# Patient Record
Sex: Female | Born: 1945 | Race: White | Hispanic: No | Smoking: Current every day smoker
Health system: Southern US, Community
[De-identification: ages and names within clinical notes are randomized; demographics above are authoritative.]

## PROBLEM LIST (undated history)

## (undated) DIAGNOSIS — J45909 Unspecified asthma, uncomplicated: Secondary | ICD-10-CM

## (undated) DIAGNOSIS — M8580 Other specified disorders of bone density and structure, unspecified site: Secondary | ICD-10-CM

## (undated) DIAGNOSIS — J449 Chronic obstructive pulmonary disease, unspecified: Secondary | ICD-10-CM

## (undated) HISTORY — DX: Other specified disorders of bone density and structure, unspecified site: M85.80

---

## 2014-08-22 LAB — HEPATIC FUNCTION PANEL
ALK PHOS: 51 U/L (ref 25–125)
ALT: 16 U/L (ref 7–35)
AST: 19 U/L (ref 13–35)

## 2014-08-22 LAB — HEPATITIS C ANTIBODY: HEP C VIRUS AB: NONREACTIVE

## 2014-08-22 LAB — VITAMIN D 1,25 DIHYDROXY: VIT D 25 HYDROXY: 25

## 2014-08-22 LAB — BASIC METABOLIC PANEL
CREATININE: 0.8 mg/dL (ref <=1.1)
Glucose: 94 mg/dL
Potassium: 4.4 mmol/L (ref 3.4–5.3)
Sodium: 141 mmol/L (ref 137–147)

## 2014-08-22 LAB — CALCIUM: Calcium: 9.3 mg/dL

## 2015-02-25 ENCOUNTER — Ambulatory Visit (INDEPENDENT_AMBULATORY_CARE_PROVIDER_SITE_OTHER): Payer: Federal, State, Local not specified - PPO | Admitting: Family Medicine

## 2015-02-25 ENCOUNTER — Encounter: Payer: Self-pay | Admitting: Family Medicine

## 2015-02-25 VITALS — BP 149/87 | HR 71 | Ht 62.0 in | Wt 130.0 lb

## 2015-02-25 DIAGNOSIS — M25552 Pain in left hip: Secondary | ICD-10-CM | POA: Diagnosis not present

## 2015-02-25 DIAGNOSIS — G459 Transient cerebral ischemic attack, unspecified: Secondary | ICD-10-CM | POA: Diagnosis not present

## 2015-02-25 DIAGNOSIS — Z23 Encounter for immunization: Secondary | ICD-10-CM | POA: Diagnosis not present

## 2015-02-25 MED ORDER — ASPIRIN EC 81 MG PO TBEC
81.0000 mg | DELAYED_RELEASE_TABLET | Freq: Every day | ORAL | Status: DC
Start: 2015-02-25 — End: 2016-07-14

## 2015-02-25 NOTE — Progress Notes (Signed)
CC: Lori Estes is a 70 y.o. female is here for Establish Care   Subjective: HPI:   very pleasant 70 year old here to establish care  Two and a half weeks ago she was raking leaves and felt a new type of headache that she's never felt before, she began to feel weak and decided to take a nap. When she woke up she had difficulty with word finding and lost proprioception in her left foot. She tells me she kept feeling like her left leg was stepping into a hole. Symptoms lasted 3-4 hours and resolved without any intervention other than lying down. Since then she's not had any continuing motor or sensory disturbances nor headache. She feels like she is back to her normal state. Over the weekend she started taking aspirin at 81 mg daily. She's never had any motor or sensory disturbances like the above in the past. She was not taking any medication at or around the time of the above issues. She is a smoker and does not know her most recent lipid panel but she brings in blood work reflecting a normal complete metabolic panel back in July.   she tells me she's always had normotensive blood pressure readings. Her systolic today surprises her.  She denies any history of a irregular heartbeat  Before leaving the [facility] area a few weeks ago she had Read on her left hip she never got the results for a plan to treat left chronic hip pain.  Review of Systems - General ROS: negative for - chills, fever, night sweats, weight gain or weight loss Ophthalmic ROS: negative for - decreased vision Psychological ROS: negative for - anxiety or depression ENT ROS: negative for - hearing change, nasal congestion, tinnitus or allergies Hematological and Lymphatic ROS: negative for - bleeding problems, bruising or swollen lymph nodes Breast ROS: negative Respiratory ROS: no cough, shortness of breath, or wheezing Cardiovascular ROS: no chest pain or dyspnea on exertion Gastrointestinal ROS: no abdominal pain, change  in bowel habits, or black or bloody stools Genito-Urinary ROS: negative for - genital discharge, genital ulcers, incontinence or abnormal bleeding from genitals Musculoskeletal ROS: negative for - joint pain or muscle painother than that described above Neurological ROS: negative for - memory loss Dermatological ROS: negative for lumps, mole changes, rash and skin lesion changes  No past medical history on file.  No past surgical history on file. No family history on file.  Social History   Social History  . Marital Status: Divorced    Spouse Name: N/A  . Number of Children: N/A  . Years of Education: N/A   Occupational History  . Not on file.   Social History Main Topics  . Smoking status: Current Every Day Smoker  . Smokeless tobacco: Not on file  . Alcohol Use: Not on file  . Drug Use: Not on file  . Sexual Activity: Not on file   Other Topics Concern  . Not on file   Social History Narrative  . No narrative on file     Objective: BP 149/87 mmHg  Pulse 71  Ht  (1.575 m)  Wt 130 lb (58.968 kg)  BMI 23.77 kg/m2  General: Alert and Oriented, No Acute Distress HEENT: Pupils equal, round, reactive to light. Conjunctivae clear.  Moist mucous membranes pharynx unremarkable Lungs: Clear to auscultation bilaterally, no wheezing/ronchi/rales.  Comfortable work of breathing. Good air movement. Cardiac: Regular rate and rhythm. Normal S1/S2.  No murmurs, rubs, nor gallops. No carotid bruit  Neuro: CN II-XII grossly intact, full strength/rom of all four extremities,  gait normal, rapid alternating movements normal, Rhomberg normal. Extremities: No peripheral edema.  Strong peripheral pulses.  Mental Status: No depression, anxiety, nor agitation. Skin: Warm and dry.  Assessment & Plan: Lori Estes was seen today for establish care.  Diagnoses and all orders for this visit:  Transient cerebral ischemia, unspecified transient cerebral ischemia type -     CBC -     aspirin  EC 81 MG tablet; Take 1 tablet (81 mg total) by mouth daily. -     US Carotid Duplex Bilateral; Future -     Echocardiogram; Future -     Direct LDL  Left hip pain  Other orders -     Flu Vaccine QUAD 36+ mos IM   Symptoms are concerning for TIA, agree with her decision to start aspirin at 81 mg. Checking a CBC today and her LDL, encouraged to quit smoking. Will readdress blood pressure at her next visit hopefully within the next few weeks. Check an echocardiogram and carotid Doppler.  She is agreeable to looking into her left hip pain at a future visit when records are available to review with her. Records have been requested.  Return if symptoms worsen or fail to improve.

## 2015-02-26 ENCOUNTER — Other Ambulatory Visit: Payer: Self-pay

## 2015-02-26 ENCOUNTER — Telehealth: Payer: Self-pay | Admitting: Family Medicine

## 2015-02-26 DIAGNOSIS — G459 Transient cerebral ischemic attack, unspecified: Secondary | ICD-10-CM

## 2015-02-26 LAB — CBC
HCT: 45.7 % (ref 36.0–46.0)
Hemoglobin: 15.8 g/dL — ABNORMAL HIGH (ref 12.0–15.0)
MCH: 34.4 pg — AB (ref 26.0–34.0)
MCHC: 34.6 g/dL (ref 30.0–36.0)
MCV: 99.6 fL (ref 78.0–100.0)
MPV: 10 fL (ref 8.6–12.4)
PLATELETS: 238 10*3/uL (ref 150–400)
RBC: 4.59 MIL/uL (ref 3.87–5.11)
RDW: 12.7 % (ref 11.5–15.5)
WBC: 6.3 10*3/uL (ref 4.0–10.5)

## 2015-02-26 LAB — LDL CHOLESTEROL, DIRECT: LDL DIRECT: 162 mg/dL — AB (ref <130)

## 2015-02-26 MED ORDER — ATORVASTATIN CALCIUM 10 MG PO TABS: 10.0000 mg | ORAL_TABLET | Freq: Every day | ORAL | Status: DC

## 2015-02-26 NOTE — Telephone Encounter (Signed)
Left message to call office for recommendations.

## 2015-02-26 NOTE — Telephone Encounter (Signed)
Will you please let patient know that her platelet count was normal but her LDL cholesterol was significantly elevated which put's her at risk of having more TIAs or even a stroke. i'd recommend starting a daily cholesterol lowering medication called atorvastatin that I'll send to [facility].

## 2015-03-03 ENCOUNTER — Ambulatory Visit (HOSPITAL_COMMUNITY)
Admission: RE | Admit: 2015-03-03 | Discharge: 2015-03-03 | Disposition: A | Discharge status: Home / Self Care | Payer: Federal, State, Local not specified - PPO | Source: Ambulatory Visit | Attending: Cardiology | Admitting: Cardiology

## 2015-03-03 DIAGNOSIS — G459 Transient cerebral ischemic attack, unspecified: Secondary | ICD-10-CM | POA: Diagnosis present

## 2015-03-03 DIAGNOSIS — I6523 Occlusion and stenosis of bilateral carotid arteries: Secondary | ICD-10-CM | POA: Insufficient documentation

## 2015-03-05 ENCOUNTER — Ambulatory Visit (HOSPITAL_BASED_OUTPATIENT_CLINIC_OR_DEPARTMENT_OTHER)
Admission: RE | Admit: 2015-03-05 | Discharge: 2015-03-05 | Disposition: A | Discharge status: Home / Self Care | Payer: Federal, State, Local not specified - PPO | Source: Ambulatory Visit | Attending: Family Medicine | Admitting: Family Medicine

## 2015-03-05 DIAGNOSIS — F17200 Nicotine dependence, unspecified, uncomplicated: Secondary | ICD-10-CM | POA: Insufficient documentation

## 2015-03-05 DIAGNOSIS — I517 Cardiomegaly: Secondary | ICD-10-CM | POA: Diagnosis not present

## 2015-03-05 DIAGNOSIS — I340 Nonrheumatic mitral (valve) insufficiency: Secondary | ICD-10-CM | POA: Insufficient documentation

## 2015-03-05 DIAGNOSIS — G459 Transient cerebral ischemic attack, unspecified: Secondary | ICD-10-CM | POA: Insufficient documentation

## 2015-03-05 NOTE — Progress Notes (Signed)
Echocardiogram 2D Echocardiogram has been performed.  Dorothey Baseman 03/05/2015, 11:39 AM

## 2015-03-06 ENCOUNTER — Encounter: Payer: Self-pay | Admitting: Family Medicine

## 2015-03-06 DIAGNOSIS — M8580 Other specified disorders of bone density and structure, unspecified site: Secondary | ICD-10-CM | POA: Insufficient documentation

## 2015-03-06 DIAGNOSIS — E559 Vitamin D deficiency, unspecified: Secondary | ICD-10-CM | POA: Insufficient documentation

## 2015-03-06 HISTORY — DX: Other specified disorders of bone density and structure, unspecified site: M85.80

## 2015-03-14 ENCOUNTER — Encounter: Payer: Self-pay | Admitting: Family Medicine

## 2015-03-14 ENCOUNTER — Ambulatory Visit (INDEPENDENT_AMBULATORY_CARE_PROVIDER_SITE_OTHER): Payer: Federal, State, Local not specified - PPO | Admitting: Family Medicine

## 2015-03-14 VITALS — BP 154/79 | HR 71 | Wt 129.0 lb

## 2015-03-14 DIAGNOSIS — G459 Transient cerebral ischemic attack, unspecified: Secondary | ICD-10-CM | POA: Diagnosis not present

## 2015-03-14 DIAGNOSIS — I10 Essential (primary) hypertension: Secondary | ICD-10-CM | POA: Diagnosis not present

## 2015-03-14 MED ORDER — AMLODIPINE BESYLATE 5 MG PO TABS
5.0000 mg | ORAL_TABLET | Freq: Every day | ORAL | Status: DC
Start: 2015-03-14 — End: 2015-06-11

## 2015-03-14 NOTE — Progress Notes (Addendum)
CC: Lori Estes is a 70 y.o. female is here for Results and medication questions   Subjective: HPI:  Follow-up TIA: Over the past month she denies any headache or new motor or sensory disturbances. She denies any irregular heartbeat or bleeding/bruising abnormality since starting on aspirin a few weeks ago.   Follow-up essential hypertension: Continues to smoke a pack a day. No current antihypertensive regimen. She tells me she's always had normal blood pressure prior to her TIA episode earlier this year. She denies chest pain shortness of breath orthopnea nor peripheral edema. Taking atorvastatin without side effects.   Review Of Systems Outlined In HPI  Past Medical History  Diagnosis Date  . Osteopenia 03/06/2015    Outside records reflect raloxifene      History reviewed. No pertinent past surgical history. History reviewed. No pertinent family history.  Social History   Social History  . Marital Status: Divorced    Spouse Name: N/A  . Number of Children: N/A  . Years of Education: N/A   Occupational History  . Not on file.   Social History Main Topics  . Smoking status: Current Every Day Smoker  . Smokeless tobacco: Not on file  . Alcohol Use: Not on file  . Drug Use: Not on file  . Sexual Activity: Not on file   Other Topics Concern  . Not on file   Social History Narrative     Objective: BP 154/79 mmHg  Pulse 71  Wt 129 lb (58.514 kg)  Vital signs reviewed. General: Alert and Oriented, No Acute Distress HEENT: Pupils equal, round, reactive to light. Conjunctivae clear.  External ears unremarkable.  Moist mucous membranes. Lungs: Clear and comfortable work of breathing, speaking in full sentences without accessory muscle use. Cardiac: Regular rate and rhythm.  Neuro: CN II-XII grossly intact, gait normal. Extremities: No peripheral edema.  Strong peripheral pulses.  Mental Status: No depression, anxiety, nor agitation. Logical though process. Skin:  Warm and dry.  Assessment & Plan: Lori Estes was seen today for results and medication questions.  Diagnoses and all orders for this visit:  Transient cerebral ischemia, unspecified transient cerebral ischemia type  Essential hypertension -     amLODipine (NORVASC) 5 MG tablet; Take 1 tablet (5 mg total) by mouth daily.   TIA: No recurrence, encouraged her to start on amlodipine for uncontrolled hypertension along with continuing atorvastatin to help minimize risk of vascular event. I also discussed the benefits of quitting smoking with respect to cardiovascular health and pulmonary health. Encouraged her to pick a quit date, sure already has Chantix at home.  Return in about 3 months (around 06/11/2015).

## 2015-04-07 ENCOUNTER — Encounter: Payer: Self-pay | Admitting: Family Medicine

## 2015-06-11 ENCOUNTER — Encounter: Payer: Self-pay | Admitting: Family Medicine

## 2015-06-11 ENCOUNTER — Ambulatory Visit (INDEPENDENT_AMBULATORY_CARE_PROVIDER_SITE_OTHER): Payer: Federal, State, Local not specified - PPO | Admitting: Family Medicine

## 2015-06-11 VITALS — BP 115/71 | HR 77 | Wt 129.0 lb

## 2015-06-11 DIAGNOSIS — I10 Essential (primary) hypertension: Secondary | ICD-10-CM | POA: Diagnosis not present

## 2015-06-11 DIAGNOSIS — G459 Transient cerebral ischemic attack, unspecified: Secondary | ICD-10-CM

## 2015-06-11 DIAGNOSIS — Z9119 Patient's noncompliance with other medical treatment and regimen: Secondary | ICD-10-CM | POA: Insufficient documentation

## 2015-06-11 DIAGNOSIS — Z23 Encounter for immunization: Secondary | ICD-10-CM | POA: Diagnosis not present

## 2015-06-11 DIAGNOSIS — Z91199 Patient's noncompliance with other medical treatment and regimen due to unspecified reason: Secondary | ICD-10-CM | POA: Insufficient documentation

## 2015-06-11 NOTE — Progress Notes (Signed)
CC: Lori Estes is a 70 y.o. female is here for Hypertension   Subjective: HPI:  Follow-up essential hypertension: She decided to never start amlodipine and was motivated to decrease sodium in her diet and start being more active. She's been checking her blood pressure at home is consistently in the normotensive range. Denies chest pain shortness of breath orthopnea nor peripheral edema.  She also decided that she did not want to take a statin.   Review Of Systems Outlined In HPI  Past Medical History  Diagnosis Date  . Osteopenia 03/06/2015    Outside records reflect raloxifene 60mg      No past surgical history on file. No family history on file.  Social History   Social History  . Marital Status: Divorced    Spouse Name: N/A  . Number of Children: N/A  . Years of Education: N/A   Occupational History  . Not on file.   Social History Main Topics  . Smoking status: Current Every Day Smoker  . Smokeless tobacco: Not on file  . Alcohol Use: Not on file  . Drug Use: Not on file  . Sexual Activity: Not on file   Other Topics Concern  . Not on file   Social History Narrative     Objective: BP 115/71 mmHg  Pulse 77  Wt 129 lb (58.514 kg)  General: Alert and Oriented, No Acute Distress HEENT: Pupils equal, round, reactive to light. Conjunctivae clear.Moist mucous membranes Lungs: Clear to auscultation bilaterally, no wheezing/ronchi/rales.  Comfortable work of breathing. Good air movement. Cardiac: Regular rate and rhythm. Normal S1/S2.  No murmurs, rubs, nor gallops.   Neuro: Cranial nerves II through XII grossly intact Extremities: No peripheral edema.  Strong peripheral pulses.  Mental Status: No depression, anxiety, nor agitation. Skin: Warm and dry.  Assessment & Plan: Lori Estes was seen today for hypertension.  Diagnoses and all orders for this visit:  Need for prophylactic vaccination against Streptococcus pneumoniae (pneumococcus) -     Pneumococcal  conjugate vaccine 13-valent  Transient cerebral ischemia, unspecified transient cerebral ischemia type  Essential hypertension   Essential hypertension: Controlled with diet and exercise no need for limiting at this time History of TIA: Have encouraged her to take atorvastatin to lower her risk of having another TIA or possibly stroke, she politely declines.  Return in about 6 months (around 12/12/2015) for Blood Pressure Review.

## 2015-06-28 ENCOUNTER — Other Ambulatory Visit: Payer: Self-pay | Admitting: Family Medicine

## 2015-06-28 ENCOUNTER — Emergency Department (INDEPENDENT_AMBULATORY_CARE_PROVIDER_SITE_OTHER)
Admission: EM | Admit: 2015-06-28 | Discharge: 2015-06-28 | Disposition: A | Discharge status: Home / Self Care | Payer: Federal, State, Local not specified - PPO | Source: Home / Self Care | Attending: Family Medicine | Admitting: Family Medicine

## 2015-06-28 ENCOUNTER — Encounter: Payer: Self-pay | Admitting: Emergency Medicine

## 2015-06-28 DIAGNOSIS — S30861A Insect bite (nonvenomous) of abdominal wall, initial encounter: Secondary | ICD-10-CM | POA: Diagnosis not present

## 2015-06-28 DIAGNOSIS — W57XXXA Bitten or stung by nonvenomous insect and other nonvenomous arthropods, initial encounter: Secondary | ICD-10-CM | POA: Diagnosis not present

## 2015-06-28 MED ORDER — DOXYCYCLINE HYCLATE 100 MG PO CAPS: 100.0000 mg | ORAL_CAPSULE | Freq: Two times a day (BID) | ORAL | Status: DC

## 2015-06-28 NOTE — ED Notes (Signed)
Pt c/o tick bite x 10 days ago, developed a circular rash on her lower left side of abdomen.  Area is red, spreading and itching.

## 2015-06-28 NOTE — ED Provider Notes (Signed)
CSN: [id number]     Arrival date & time 06/28/15  1113 History   First MD Initiated Contact with Patient 06/28/15 1254     Chief Complaint  Patient presents with  . Rash      HPI Comments: Patient found an embedded tick on her left abdomen about 10 days ago, and believes that it had been present for less than four hours, and not engorged.  She then developed a rash at the tick site which has been increasing in size.  She feels well otherwise.  The history is provided by the patient.    Past Medical History  Diagnosis Date  . Osteopenia 03/06/2015    Outside records reflect raloxifene 60mg     History reviewed. No pertinent past surgical history. No family history on file. Social History  Substance Use Topics  . Smoking status: Current Every Day Smoker  . Smokeless tobacco: None  . Alcohol Use: None   OB History    No data available     Review of Systems  Constitutional: Negative for fever, chills, diaphoresis, activity change, appetite change and fatigue.  HENT: Negative.   Eyes: Negative.   Respiratory: Negative.   Cardiovascular: Negative.   Gastrointestinal: Negative.   Genitourinary: Negative.   Musculoskeletal: Negative.   Skin: Positive for rash.  Neurological: Negative for headaches.    Allergies  Ceftin and Doxycycline  Home Medications   Prior to Admission medications   Medication Sig Start Date End Date Taking? Authorizing Provider  aspirin EC 81 MG tablet Take 1 tablet (81 mg total) by mouth daily. 02/25/15   Laren BoomSean Hommel, DO  doxycycline (VIBRAMYCIN) 100 MG capsule Take 1 capsule (100 mg total) by mouth 2 (two) times daily. Take with food. 06/28/15   Lattie HawStephen A Emani Morad, MD   Meds Ordered and Administered this Visit  Medications - No data to display  BP 156/79 mmHg  Pulse 69  Temp(Src) 98.3 F (36.8 C) (Oral)  Ht 5\' 2"  (1.575 m)  Wt 130 lb 4 oz (59.081 kg)  BMI 23.82 kg/m2  SpO2 97% No data found.   Physical Exam  Constitutional: She is oriented to  person, place, and time. She appears well-developed and well-nourished. No distress.  HENT:  Head: Normocephalic.  Mouth/Throat: Oropharynx is clear and moist.  Eyes: Conjunctivae are normal. Pupils are equal, round, and reactive to light.  Neck: Neck supple.  Cardiovascular: Normal heart sounds.   Pulmonary/Chest: Breath sounds normal.  Abdominal: There is no tenderness.    Left mid-abdomen has an erythematous "bull's eye" lesion approximately 8cm diameter.  No fluctuance, swelling, or tenderness to palpation.  Musculoskeletal: She exhibits no edema.  Lymphadenopathy:    She has no cervical adenopathy.  Neurological: She is alert and oriented to person, place, and time.  Skin: Skin is warm and dry.  Nursing note and vitals reviewed.   ED Course  Procedures none    Labs Reviewed  ROCKY MTN SPOTTED FVR ABS PNL(IGG+IGM)  B. BURGDORFI ANTIBODIES     MDM   1. Tick bite of abdomen, initial encounter    RMSF and Lyme Disease antibodies pending.  Begin empiric doxycycline (patient notes that she can take doxycycline if she eats bread beforehand) Followup with Family Doctor if not improved in one week.     Lattie HawStephen A Jarry Manon, MD 07/03/15 [id number]

## 2015-06-28 NOTE — Discharge Instructions (Signed)
Tick Bite Information °Ticks are insects that attach themselves to the skin. There are many types of ticks. Common types include wood ticks and deer ticks. Sometimes, ticks carry diseases that can make a person very ill. The most common places for ticks to attach themselves are the scalp, neck, armpits, waist, and groin.  °HOW CAN YOU PREVENT TICK BITES? °Take these steps to help prevent tick bites when you are outdoors: °· Wear long sleeves and long pants. °· Wear white clothes so you can see ticks more easily. °· Tuck your pant legs into your socks. °· If walking on a trail, stay in the middle of the trail to avoid brushing against bushes. °· Avoid walking through areas with long grass. °· Put bug spray on all skin that is showing and along boot tops, pant legs, and sleeve cuffs. °· Check clothes, hair, and skin often and before going inside. °· Brush off any ticks that are not attached. °· Take a shower or bath as soon as possible after being outdoors. °HOW SHOULD YOU REMOVE A TICK? °Ticks should be removed as soon as possible to help prevent diseases. °1. If latex gloves are available, put them on before trying to remove a tick. °2. Use tweezers to grasp the tick as close to the skin as possible. You may also use curved forceps or a tick removal tool. Grasp the tick as close to its head as possible. Avoid grasping the tick on its body. °3. Pull gently upward until the tick lets go. Do not twist the tick or jerk it suddenly. This may break off the tick's head or mouth parts. °4. Do not squeeze or crush the tick's body. This could force disease-carrying fluids from the tick into your body. °5. After the tick is removed, wash the bite area and your hands with soap and water or alcohol. °6. Apply a small amount of antiseptic cream or ointment to the bite site. °7. Wash any tools that were used. °Do not try to remove a tick by applying a hot match, petroleum jelly, or fingernail polish to the tick. These methods do  not work. They may also increase the chances of disease being spread from the tick bite. °WHEN SHOULD YOU SEEK HELP? °Contact your health care provider if you are unable to remove a tick or if a part of the tick breaks off in the skin. °After a tick bite, you need to watch for signs and symptoms of diseases that can be spread by ticks. Contact your health care provider if you develop any of the following: °· Fever. °· Rash. °· Redness and puffiness (swelling) in the area of the tick bite. °· Tender, puffy lymph glands. °· Watery poop (diarrhea). °· Weight loss. °· Cough. °· Feeling more tired than normal (fatigue). °· Muscle, joint, or bone pain. °· Belly (abdominal) pain. °· Headache. °· Change in your level of consciousness. °· Trouble walking or moving your legs. °· Loss of feeling (numbness) in the legs. °· Loss of movement (paralysis). °· Shortness of breath. °· Confusion. °· Throwing up (vomiting) many times. °  °This information is not intended to replace advice given to you by your health care provider. Make sure you discuss any questions you have with your health care provider. °  °Document Released: 04/14/2009 Document Revised: 09/20/2012 Document Reviewed: 06/28/2012 °Elsevier Interactive Patient Education ©2016 [employer] ° °

## 2015-07-01 LAB — LYME AB/WESTERN BLOT REFLEX

## 2015-07-02 ENCOUNTER — Telehealth: Payer: Self-pay | Admitting: *Deleted

## 2015-07-02 LAB — ROCKY MTN SPOTTED FVR ABS PNL(IGG+IGM)
RMSF IGM: NOT DETECTED
RMSF IgG: NOT DETECTED

## 2015-07-02 NOTE — ED Notes (Unsigned)
LM with lab results and to call back if she has any questions or concerns. Clemens Catholichristy Dakiyah Heinke, LPN

## 2015-09-04 ENCOUNTER — Emergency Department (INDEPENDENT_AMBULATORY_CARE_PROVIDER_SITE_OTHER)
Admission: EM | Admit: 2015-09-04 | Discharge: 2015-09-04 | Disposition: A | Discharge status: Home / Self Care | Payer: Federal, State, Local not specified - PPO | Source: Home / Self Care | Attending: Family Medicine | Admitting: Family Medicine

## 2015-09-04 ENCOUNTER — Encounter: Payer: Self-pay | Admitting: Emergency Medicine

## 2015-09-04 DIAGNOSIS — R112 Nausea with vomiting, unspecified: Secondary | ICD-10-CM | POA: Diagnosis not present

## 2015-09-04 DIAGNOSIS — M545 Low back pain, unspecified: Secondary | ICD-10-CM

## 2015-09-04 DIAGNOSIS — N390 Urinary tract infection, site not specified: Secondary | ICD-10-CM | POA: Diagnosis not present

## 2015-09-04 DIAGNOSIS — R197 Diarrhea, unspecified: Secondary | ICD-10-CM

## 2015-09-04 LAB — POCT URINALYSIS DIP (MANUAL ENTRY)
Bilirubin, UA: NEGATIVE
Blood, UA: NEGATIVE
Glucose, UA: NEGATIVE
Nitrite, UA: NEGATIVE
Protein Ur, POC: NEGATIVE
Spec Grav, UA: 1.025 (ref 1.005–1.03)
Urobilinogen, UA: 0.2 (ref 0–1)
pH, UA: 6.5 (ref 5–8)

## 2015-09-04 MED ORDER — ONDANSETRON HCL 4 MG PO TABS
4.0000 mg | ORAL_TABLET | Freq: Four times a day (QID) | ORAL | 0 refills | Status: DC
Start: 2015-09-04 — End: 2016-07-14

## 2015-09-04 MED ORDER — AMOXICILLIN-POT CLAVULANATE 875-125 MG PO TABS: 1.0000 | ORAL_TABLET | Freq: Two times a day (BID) | ORAL | 0 refills | Status: DC

## 2015-09-04 NOTE — Discharge Instructions (Signed)
°  You may try over the counter imodium or peptobismol if you are having more than 3-4 episodes of diarrhea a day.  It is recommended you follow up with your Primary care provider or come back tomorrow for recheck of symptoms.

## 2015-09-04 NOTE — ED Provider Notes (Signed)
CSN: [id number]     Arrival date & time 09/04/15  1934 History   First MD Initiated Contact with Patient 09/04/15 1955     Chief Complaint  Patient presents with  . Back Pain   (Consider location/radiation/quality/duration/timing/severity/associated sxs/prior Treatment) HPI  Lori Estes is a 70 y.o. female presenting to UC with c/o intermittent diarrhea with watery stool 4-5 times daily for 1 week, no blood or mucous in stool, associated Right side low back pain for the last 2-3 days as she notes she is in the middle of a move and thinks she pulled a muscle.  Pain is burning in sensation moderate in severity, worse with movement and palpation. Last night she developed nausea and vomiting, vomiting twice with several dry heaves since onset of nausea. Last episode of vomiting was around 2PM. She has not tried any OTC medications today but had been taking ibuprofen for her back and wonders if that caused her stomach upset. No sick contacts or recent travel. No urinary symptoms.    Past Medical History:  Diagnosis Date  . Osteopenia 03/06/2015   Outside records reflect raloxifene     History reviewed. No pertinent surgical history. No family history on file. Social History  Substance Use Topics  . Smoking status: Current Every Day Smoker  . Smokeless tobacco: Never Used  . Alcohol use Not on file   OB History    No data available     Review of Systems  Constitutional: Positive for appetite change. Negative for chills, fatigue and fever.  HENT: Negative for congestion, rhinorrhea and sore throat.   Respiratory: Negative for cough and shortness of breath.   Gastrointestinal: Positive for diarrhea, nausea and vomiting. Negative for abdominal pain and blood in stool.  Genitourinary: Negative for dysuria, frequency and genital sores.  Musculoskeletal: Positive for back pain (Right lowr) and myalgias. Negative for gait problem, neck pain and neck stiffness.  Skin: Negative for rash.     Allergies  Ceftin [cefuroxime axetil] and Doxycycline  Home Medications   Prior to Admission medications   Medication Sig Start Date End Date Taking? Authorizing Provider  naproxen sodium (ANAPROX) 220 MG tablet Take 220 mg by mouth 2 (two) times daily with a meal.   Yes Historical Provider, MD  amoxicillin-clavulanate (AUGMENTIN) 875-125 MG tablet Take 1 tablet by mouth 2 (two) times daily. One po bid x 7 days 09/04/15   Junius Finner, PA-C  aspirin EC 81 MG tablet Take 1 tablet (81 mg total) by mouth daily. 02/25/15   Laren Boom, DO  doxycycline (VIBRAMYCIN) 100 MG capsule Take 1 capsule (100 mg total) by mouth 2 (two) times daily. Take with food. 06/28/15   Lattie Haw, MD  ondansetron (ZOFRAN) 4 MG tablet Take 1 tablet (4 mg total) by mouth every 6 (six) hours. 09/04/15   Junius Finner, PA-C   Meds Ordered and Administered this Visit  Medications - No data to display  BP 178/93 (BP Location: Left Arm)   Pulse 75   Temp 98.2 F (36.8 C) (Oral)   Ht  (1.575 m)   Wt 128 lb (58.1 kg)   SpO2 97%   BMI 23.41 kg/m  No data found.   Physical Exam  Constitutional: She is oriented to person, place, and time. She appears well-developed and well-nourished.  HENT:  Head: Normocephalic and atraumatic.  Mouth/Throat: Oropharynx is clear and moist.  Eyes: EOM are normal.  Neck: Normal range of motion. Neck supple.  Cardiovascular: Normal rate  and regular rhythm.   Pulmonary/Chest: Effort normal and breath sounds normal. No respiratory distress. She has no wheezes. She has no rales.  Abdominal: Soft. She exhibits no distension and no mass. There is no tenderness. There is no rebound and no guarding. No hernia.  Musculoskeletal: Normal range of motion. She exhibits tenderness. She exhibits no edema or deformity.  No midline spinal tenderness. Full ROM upper and lower extremities. Tenderness to Right lower lumbar muscle.   Neurological: She is alert and oriented to person, place,  and time.  Skin: Skin is warm and dry. No rash noted. No erythema.  Right lower back: skin in tact, no ecchymosis, erythema, rash or lesions.   Psychiatric: She has a normal mood and affect. Her behavior is normal.  Nursing note and vitals reviewed.   Urgent Care Course   Clinical Course    Procedures (including critical care time)  Labs Review Labs Reviewed  POCT URINALYSIS DIP (MANUAL ENTRY) - Abnormal; Notable for the following:       Result Value   Ketones, POC UA moderate (40) (*)    Leukocytes, UA small (1+) (*)    All other components within normal limits  URINE CULTURE    Imaging Review No results found.    MDM   1. Bilateral low back pain without sciatica   2. UTI (lower urinary tract infection)   3. Nausea vomiting and diarrhea    Pt c/o nausea, vomiting, and diarrhea. Also c/o Right lower back pain after moving boxes but also c/o burning pain with light touch. No rashes noted.  UA c/w UTI. Will treat UTI and nausea, will consider early onset shingles, however, will hold off on antivirals as this time as to not complicate symptoms.   Encouraged f/u tomorrow with PCP or return to Urgent care for recheck of symptoms. Pt able to keep down several ounces of ginger ale in UC.  Patient verbalized understanding and agreement with treatment plan.     Junius Finner, PA-C 09/04/15 2025

## 2015-09-04 NOTE — ED Triage Notes (Signed)
Low back pain x 2 days, diarrhea x 1 week, N&V started last night

## 2015-09-07 LAB — URINE CULTURE: Organism ID, Bacteria: NO GROWTH

## 2015-09-08 ENCOUNTER — Ambulatory Visit (INDEPENDENT_AMBULATORY_CARE_PROVIDER_SITE_OTHER): Payer: Federal, State, Local not specified - PPO | Admitting: Family Medicine

## 2015-09-08 ENCOUNTER — Encounter: Payer: Self-pay | Admitting: Family Medicine

## 2015-09-08 DIAGNOSIS — M545 Low back pain, unspecified: Secondary | ICD-10-CM | POA: Insufficient documentation

## 2015-09-08 NOTE — Progress Notes (Signed)
CC: Lori Estes is a 70 y.o. female is here for Back Pain   Subjective: HPI:  Early last week she woke up with a soreness in her right lower back. Was nonradiating. It was bad enough to keep her in bed for the majority of Thursday. Her sister brought her here to be evaluated or care. She tells me that the pain was worse with any movement of the torso or with light touch to the skin in the right flank and right low back. She was started on Augmentin for concerns of possibly having a UTI. She tells me that she typically easy up until Sunday when she was able to be more mobile and feels like the pain is almost 100% gone. Nothing right now particularly makes symptoms worse however it improves with Aleve. She denies any midline back pain. She denies any rash over the site of her discomfort. She continues to state that she does not have any urinary urgency hesitancy frequency or dysuria   Review Of Systems Outlined In HPI  Past Medical History:  Diagnosis Date  . Osteopenia 03/06/2015   Outside records reflect raloxifene 60mg      No past surgical history on file. No family history on file.  Social History   Social History  . Marital status: Divorced    Spouse name: N/A  . Number of children: N/A  . Years of education: N/A   Occupational History  . Not on file.   Social History Main Topics  . Smoking status: Current Every Day Smoker  . Smokeless tobacco: Never Used  . Alcohol use Not on file  . Drug use: Unknown  . Sexual activity: Not on file   Other Topics Concern  . Not on file   Social History Narrative  . No narrative on file     Objective: BP (!) 145/79   Pulse 76   Wt 125 lb (56.7 kg)   BMI 22.86 kg/m   Vital signs reviewed. General: Alert and Oriented, No Acute Distress HEENT: Pupils equal, round, reactive to light. Conjunctivae clear.  External ears unremarkable.  Moist mucous membranes. Lungs: Clear and comfortable work of breathing, speaking in full sentences  without accessory muscle use. Cardiac: Regular rate and rhythm.  Neuro: CN II-XII grossly intact, gait normal. Extremities: No peripheral edema.  Strong peripheral pulses.  Mental Status: No depression, anxiety, nor agitation. Logical though process. Skin: Warm and dry. No rash or abnormal palpable masses in the right low lumbar region where her pain was localized.  Assessment & Plan: Lori Estes was seen today for back pain.  Diagnoses and all orders for this visit:  Right-sided low back pain without sciatica   Low back pain most likely due to muscular strain given heavy lifting the day preceding onset of her pain. No further intervention is needed it appears To be almost 100% healed.  No Follow-up on file.   Discussed with this patient that I will be resigning from my position [facility] in September in order to stay with my [place] [facility] [place]. I let him know about the providers that are still accepting patients and I feel that this individual will be under great care if he/she stays here [facility].

## 2016-07-14 ENCOUNTER — Ambulatory Visit (INDEPENDENT_AMBULATORY_CARE_PROVIDER_SITE_OTHER): Payer: Federal, State, Local not specified - PPO

## 2016-07-14 ENCOUNTER — Encounter: Payer: Self-pay | Admitting: Physician Assistant

## 2016-07-14 ENCOUNTER — Ambulatory Visit (INDEPENDENT_AMBULATORY_CARE_PROVIDER_SITE_OTHER): Payer: Federal, State, Local not specified - PPO | Admitting: Physician Assistant

## 2016-07-14 VITALS — BP 179/79 | HR 84 | Ht 62.0 in | Wt 131.0 lb

## 2016-07-14 DIAGNOSIS — F17200 Nicotine dependence, unspecified, uncomplicated: Secondary | ICD-10-CM | POA: Diagnosis not present

## 2016-07-14 DIAGNOSIS — R0602 Shortness of breath: Secondary | ICD-10-CM | POA: Diagnosis not present

## 2016-07-14 DIAGNOSIS — R030 Elevated blood-pressure reading, without diagnosis of hypertension: Secondary | ICD-10-CM

## 2016-07-14 DIAGNOSIS — R05 Cough: Secondary | ICD-10-CM | POA: Diagnosis not present

## 2016-07-14 DIAGNOSIS — R197 Diarrhea, unspecified: Secondary | ICD-10-CM | POA: Diagnosis not present

## 2016-07-14 NOTE — Progress Notes (Signed)
   Subjective:    Patient ID: Lori Estes, female    DOB: 1946-02-01, 71 y.o.   MRN: [id number]  HPI  Lori Estes is a 71 yo female who presents to the clinic to establish care and to have her stool tested for parasites. Her dogs have hookworms and her vet suggested she be tested. She has had diarrhea for the last 3 months that she describes as loose brown and watery. Denies any blood or mucus. She is having no abdominal pain. Loose stools seem to occur in the morning with some urgency but then no problem through out the day. Denies any anal itching. No new medications.   Elevated blood pressure- she reports taking bp regularly and always 120's over 80's. She denies any CP, palpitations, headaches, or vision changes.   She has noticed she is SOB more often. She has smoked for 40 years about 1 pack a day. She denies any lower leg edema. She is very active and gardens a lot. She has slightly productive cough with clear sputum.   .. Active Ambulatory Problems    Diagnosis Date Noted  . Left hip pain 02/25/2015  . TIA (transient ischemic attack) 02/25/2015  . Vitamin D deficiency 03/06/2015  . Osteopenia 03/06/2015  . Essential hypertension 03/14/2015  . Non-compliance 06/11/2015  . Right low back pain 09/08/2015  . Elevated blood pressure reading 07/15/2016  . Current smoker 07/15/2016  . SOB (shortness of breath) 07/15/2016  . Diarrhea 07/15/2016   Resolved Ambulatory Problems    Diagnosis Date Noted  . No Resolved Ambulatory Problems   Past Medical History:  Diagnosis Date  . Osteopenia 03/06/2015        Review of Systems    see HPI.  Objective:   Physical Exam  Constitutional: She is oriented to person, place, and time. She appears well-developed and well-nourished.  HENT:  Head: Normocephalic and atraumatic.  Neck: Normal range of motion. Neck supple. No thyromegaly present.  Cardiovascular: Normal rate, regular rhythm and normal heart sounds.   Pulmonary/Chest: Effort normal  and breath sounds normal. She has no wheezes.  Abdominal: Soft. Bowel sounds are normal. She exhibits no distension and no mass. There is no tenderness. There is no rebound and no guarding.  Lymphadenopathy:    She has no cervical adenopathy.  Neurological: She is alert and oriented to person, place, and time.  Skin:  No edema.   Psychiatric: She has a normal mood and affect. Her behavior is normal.          Assessment & Plan:  Marland Kitchen.Marland Kitchen.Diagnoses and all orders for this visit:  Diarrhea, unspecified type -     Stool Culture -     Ova and parasite examination -     COMPLETE METABOLIC PANEL WITH GFR -     CBC with Differential/Platelet -     TSH  SOB (shortness of breath) -     DG Chest 2 View  Current smoker -     Ambulatory Referral for Lung Cancer Scre  Elevated blood pressure reading   Will test stool to look at causes of diarrhea. No abdominal pain does not appear to be an inflammatory bowel issue. IBS usually has pain and relieved by bowel movement. Unclear etiology.  I believe patient will qualify for low dose CT. Will make referral.  Discussed with patient needs a CPE.  Continue to monitor BP at home. Bring in log.  Lori Estes not interested in smoking cessation.

## 2016-07-15 DIAGNOSIS — Z87891 Personal history of nicotine dependence: Secondary | ICD-10-CM | POA: Insufficient documentation

## 2016-07-15 DIAGNOSIS — R030 Elevated blood-pressure reading, without diagnosis of hypertension: Secondary | ICD-10-CM | POA: Insufficient documentation

## 2016-07-15 DIAGNOSIS — R197 Diarrhea, unspecified: Secondary | ICD-10-CM | POA: Insufficient documentation

## 2016-07-15 DIAGNOSIS — R0602 Shortness of breath: Secondary | ICD-10-CM | POA: Insufficient documentation

## 2016-07-15 LAB — COMPLETE METABOLIC PANEL WITH GFR
ALT: 11 U/L (ref 6–29)
AST: 15 U/L (ref 10–35)
Albumin: 4.2 g/dL (ref 3.6–5.1)
Alkaline Phosphatase: 52 U/L (ref 33–130)
BUN: 15 mg/dL (ref 7–25)
CALCIUM: 8.9 mg/dL (ref 8.6–10.4)
CHLORIDE: 104 mmol/L (ref 98–110)
CO2: 27 mmol/L (ref 20–31)
Creat: 0.77 mg/dL (ref 0.50–0.99)
GFR, EST NON AFRICAN AMERICAN: 79 mL/min (ref >=60)
Glucose, Bld: 94 mg/dL (ref 65–99)
POTASSIUM: 4.5 mmol/L (ref 3.5–5.3)
Sodium: 139 mmol/L (ref 135–146)
Total Bilirubin: 0.8 mg/dL (ref 0.2–1.2)
Total Protein: 6 g/dL — ABNORMAL LOW (ref 6.1–8.1)

## 2016-07-15 LAB — CBC WITH DIFFERENTIAL/PLATELET
BASOS PCT: 0 %
Basophils Absolute: 0 cells/uL (ref 0–200)
Eosinophils Absolute: 57 cells/uL (ref 15–500)
Eosinophils Relative: 1 %
HEMATOCRIT: 43.6 % (ref 35.0–45.0)
HEMOGLOBIN: 14.8 g/dL (ref 11.7–15.5)
LYMPHS ABS: 2850 {cells}/uL (ref 850–3900)
LYMPHS PCT: 50 %
MCH: 33.9 pg — ABNORMAL HIGH (ref 27.0–33.0)
MCHC: 33.9 g/dL (ref 32.0–36.0)
MCV: 99.8 fL (ref 80.0–100.0)
MONO ABS: 342 {cells}/uL (ref 200–950)
MPV: 9.9 fL (ref 7.5–12.5)
Monocytes Relative: 6 %
NEUTROS ABS: 2451 {cells}/uL (ref 1500–7800)
Neutrophils Relative %: 43 %
Platelets: 208 10*3/uL (ref 140–400)
RBC: 4.37 MIL/uL (ref 3.80–5.10)
RDW: 13.2 % (ref 11.0–15.0)
WBC: 5.7 10*3/uL (ref 3.8–10.8)

## 2016-07-16 LAB — OVA AND PARASITE EXAMINATION: OP: NONE SEEN

## 2016-07-16 NOTE — Progress Notes (Signed)
Call pt: no ova or parasites found in stool.

## 2016-07-19 LAB — STOOL CULTURE

## 2016-07-19 NOTE — Progress Notes (Signed)
Call pt: negative stool culture. No bacteria. How is diarrhea?

## 2016-12-11 ENCOUNTER — Emergency Department (INDEPENDENT_AMBULATORY_CARE_PROVIDER_SITE_OTHER)
Admission: EM | Admit: 2016-12-11 | Discharge: 2016-12-11 | Disposition: A | Discharge status: Home / Self Care | Payer: Federal, State, Local not specified - PPO | Source: Home / Self Care | Attending: Family Medicine | Admitting: Family Medicine

## 2016-12-11 ENCOUNTER — Encounter: Payer: Self-pay | Admitting: Emergency Medicine

## 2016-12-11 ENCOUNTER — Emergency Department (INDEPENDENT_AMBULATORY_CARE_PROVIDER_SITE_OTHER): Payer: Federal, State, Local not specified - PPO

## 2016-12-11 DIAGNOSIS — J9801 Acute bronchospasm: Secondary | ICD-10-CM

## 2016-12-11 DIAGNOSIS — Z720 Tobacco use: Secondary | ICD-10-CM

## 2016-12-11 DIAGNOSIS — R05 Cough: Secondary | ICD-10-CM | POA: Diagnosis not present

## 2016-12-11 DIAGNOSIS — J069 Acute upper respiratory infection, unspecified: Secondary | ICD-10-CM | POA: Diagnosis not present

## 2016-12-11 DIAGNOSIS — R0602 Shortness of breath: Secondary | ICD-10-CM

## 2016-12-11 DIAGNOSIS — B9789 Other viral agents as the cause of diseases classified elsewhere: Secondary | ICD-10-CM | POA: Diagnosis not present

## 2016-12-11 MED ORDER — IPRATROPIUM-ALBUTEROL 0.5-2.5 (3) MG/3ML IN SOLN: 3.0000 mL | Freq: Once | RESPIRATORY_TRACT | Status: DC

## 2016-12-11 MED ORDER — BENZONATATE 200 MG PO CAPS: ORAL_CAPSULE | ORAL | 0 refills | Status: DC

## 2016-12-11 MED ORDER — AZITHROMYCIN 250 MG PO TABS: ORAL_TABLET | ORAL | 0 refills | Status: DC

## 2016-12-11 MED ORDER — BUPROPION HCL ER (XL) 300 MG PO TB24: 300.0000 mg | ORAL_TABLET | ORAL | 0 refills | Status: DC

## 2016-12-11 MED ORDER — IPRATROPIUM-ALBUTEROL 20-100 MCG/ACT IN AERS: 1.0000 | INHALATION_SPRAY | Freq: Four times a day (QID) | RESPIRATORY_TRACT | 1 refills | Status: DC | PRN

## 2016-12-11 MED ORDER — PREDNISONE 20 MG PO TABS: ORAL_TABLET | ORAL | 0 refills | Status: DC

## 2016-12-11 MED ORDER — BUPROPION HCL ER (XL) 150 MG PO TB24: ORAL_TABLET | ORAL | 0 refills | Status: DC

## 2016-12-11 NOTE — Discharge Instructions (Signed)
Take plain guaifenesin (1200mg extended release tabs such as Mucinex) twice daily, with plenty of water, for cough and congestion.  May add Pseudoephedrine (30mg, one or two every 4 to 6 hours) for sinus congestion.  Get adequate rest.   °May use Afrin nasal spray (or generic oxymetazoline) each morning for about 5 days and then discontinue.  Also recommend using saline nasal spray several times daily and saline nasal irrigation (AYR is a common brand).  Use Flonase nasal spray each morning after using Afrin nasal spray and saline nasal irrigation. °Try warm salt water gargles for sore throat.  °Stop all antihistamines for now, and other non-prescription cough/cold preparations. °  °Follow-up with family doctor if not improving about10 days.  °

## 2016-12-11 NOTE — ED Provider Notes (Signed)
[facility]    CSN: [id number] Arrival date & time: 12/11/16  0944     History   Chief Complaint Chief Complaint  Patient presents with  . Cough    HPI Lori Estes is a 71 y.o. female.   Patient became fatigued about 6 days ago.  During the past four days she has developed increased sinus congestion, followed by cough, headache, and myalgias.  She has developed wheezing and shortness of breath with activity.  No pleuritic pain. She has had chills but no fever.  She admits that she has had increased dyspnea with activity for about three months.  She has a past history of pneumonia, but continues to smoke.  She states that she would like to begin a smoking cessation program (she has an intolerance to Chantix).   The history is provided by the patient.    Past Medical History:  Diagnosis Date  . Osteopenia 03/06/2015   Outside records reflect raloxifene 60mg      Patient Active Problem List   Diagnosis Date Noted  . Elevated blood pressure reading 07/15/2016  . Current smoker 07/15/2016  . SOB (shortness of breath) 07/15/2016  . Diarrhea 07/15/2016  . Right low back pain 09/08/2015  . Non-compliance 06/11/2015  . Essential hypertension 03/14/2015  . Vitamin D deficiency 03/06/2015  . Osteopenia 03/06/2015  . Left hip pain 02/25/2015  . TIA (transient ischemic attack) 02/25/2015    History reviewed. No pertinent surgical history.  OB History    No data available       Home Medications    Prior to Admission medications   Medication Sig Start Date End Date Taking? Authorizing Provider  azithromycin (ZITHROMAX Z-PAK) 250 MG tablet Take 2 tabs today; then begin one tab once daily for 4 more days. 12/11/16   Lattie HawBeese, Bellagrace Sylvan A, MD  benzonatate (TESSALON) 200 MG capsule Take one cap by mouth at bedtime as needed for cough.  May repeat in 4 to 6 hours 12/11/16   Lattie HawBeese, Shiro Ellerman A, MD  buPROPion (WELLBUTRIN XL) 150 MG 24 hr tablet Take one PO each morning on  days 1 through 3. 12/11/16   Lattie HawBeese, Kanyon Seibold A, MD  buPROPion (WELLBUTRIN XL) 300 MG 24 hr tablet Take 1 tablet (300 mg total) every morning by mouth. (begin on day 4) 12/11/16 01/10/17  Lattie HawBeese, Maisyn Nouri A, MD  Ipratropium-Albuterol (COMBIVENT) 20-100 MCG/ACT AERS respimat Inhale 1 puff every 6 (six) hours as needed into the lungs. 12/11/16   Lattie HawBeese, Quintarius Ferns A, MD  predniSONE (DELTASONE) 20 MG tablet Take one tab by mouth twice daily for 4 days, then one daily for 3 days. Take with food. 12/11/16   Lattie HawBeese, Ole Lafon A, MD    Family History No family history on file.  Social History Social History   Tobacco Use  . Smoking status: Current Every Day Smoker    Packs/day: 1.00    Types: Cigarettes  . Smokeless tobacco: Never Used  Substance Use Topics  . Alcohol use: Yes    Alcohol/week: 0.0 oz    Comment: 4 drinks weekly  . Drug use: Not on file     Allergies   Ceftin [cefuroxime axetil] and Doxycycline   Review of Systems Review of Systems No sore throat + cough No pleuritic pain + wheezing + nasal congestion + post-nasal drainage No sinus pain/pressure No itchy/red eyes No earache No hemoptysis + SOB No fever, + chills No nausea No vomiting No abdominal pain No diarrhea No urinary symptoms No skin  rash + fatigue + myalgias + headache Used OTC meds without relief   Physical Exam Triage Vital Signs ED Triage Vitals  Enc Vitals Group     BP 12/11/16 0959 (!) 161/74     Pulse Rate 12/11/16 0959 84     Resp --      Temp 12/11/16 0959 98.5 F (36.9 C)     Temp Source 12/11/16 0959 Oral     SpO2 12/11/16 0959 97 %     Weight 12/11/16 0959 128 lb 8 oz (58.3 kg)     Height 12/11/16 0959 5\' 2"  (1.575 m)     Head Circumference --      Peak Flow --      Pain Score 12/11/16 1000 0     Pain Loc --      Pain Edu? --      Excl. in GC? --    No data found.  Updated Vital Signs BP (!) 161/74 (BP Location: Right Arm)   Pulse 84   Temp 98.5 F (36.9 C) (Oral)   Ht  5\' 2"  (1.575 m)   Wt 128 lb 8 oz (58.3 kg)   SpO2 97%   BMI 23.50 kg/m   Visual Acuity Right Eye Distance:   Left Eye Distance:   Bilateral Distance:    Right Eye Near:   Left Eye Near:    Bilateral Near:     Physical Exam Nursing notes and Vital Signs reviewed. Appearance:  Patient appears stated age, and in no acute distress Eyes:  Pupils are equal, round, and reactive to light and accomodation.  Extraocular movement is intact.  Conjunctivae are not inflamed  Ears:  Canals normal.  Tympanic membranes normal.  Nose:  Mildly congested turbinates.  No sinus tenderness.    Pharynx:  Normal Neck:  Supple.  Enlarged posterior/lateral nodes are palpated bilaterally, tender to palpation on the left.   Lungs:     Breath sounds are decreased but equal.  Moving air well. Heart:  Regular rate and rhythm without murmurs, rubs, or gallops.  Abdomen:  Nontender without masses or hepatosplenomegaly.  Bowel sounds are present.  No CVA or flank tenderness.  Extremities:  No edema.  Skin:  No rash present.    UC Treatments / Results  Labs (all labs ordered are listed, but only abnormal results are displayed) Labs Reviewed - No data to display  EKG  EKG Interpretation None       Radiology Dg Chest 2 View  Result Date: 12/11/2016 CLINICAL DATA:  Cough and shortness of breath EXAM: CHEST  2 VIEW COMPARISON:  July 14, 2016 FINDINGS: Lungs are clear. Heart size and pulmonary vascularity are normal. No adenopathy. No pneumothorax. No bone lesions. IMPRESSION: No edema or consolidation. Electronically Signed   By: Bretta Bang III M.D.   On: 12/11/2016 10:49    Procedures Procedures (including critical care time)  Medications Ordered in UC Medications  ipratropium-albuterol (DUONEB) 0.5-2.5 (3) MG/3ML nebulizer solution 3 mL (not administered)     Initial Impression / Assessment and Plan / UC Course  I have reviewed the triage vital signs and the nursing notes.  Pertinent  labs & imaging results that were available during my care of the patient were reviewed by me and considered in my medical decision making (see chart for details).    Administered DuoNeb by hand held nebulizer  Begin prednisone burst/taper, and empiric Z-pak. Prescription written for Benzonatate [facility]) to take at bedtime for night-time cough.  Begin trial of Combivent  After treatment for present URI, begin smoking cessation therapy with Wellbutrin XL. Take plain guaifenesin (1200mg  extended release tabs such as Mucinex) twice daily, with plenty of water, for cough and congestion.  May add Pseudoephedrine (30mg , one or two every 4 to 6 hours) for sinus congestion.  Get adequate rest.   May use Afrin nasal spray (or generic oxymetazoline) each morning for about 5 days and then discontinue.  Also recommend using saline nasal spray several times daily and saline nasal irrigation (AYR is a common brand).  Use Flonase nasal spray each morning after using Afrin nasal spray and saline nasal irrigation. Try warm salt water gargles for sore throat.  Stop all antihistamines for now, and other non-prescription cough/cold preparations.   Follow-up with family doctor if not improving about10 days.  Followup with Family Doctor in about one month for smoking cessation follow-up.    Final Clinical Impressions(s) / UC Diagnoses   Final diagnoses:  Viral URI with cough  Bronchospasm, acute  Tobacco abuse    ED Discharge Orders        Ordered    azithromycin (ZITHROMAX Z-PAK) 250 MG tablet     12/11/16 1108    predniSONE (DELTASONE) 20 MG tablet     12/11/16 1108    benzonatate (TESSALON) 200 MG capsule     12/11/16 1108    Ipratropium-Albuterol (COMBIVENT) 20-100 MCG/ACT AERS respimat  Every 6 hours PRN     12/11/16 1108    buPROPion (WELLBUTRIN XL) 150 MG 24 hr tablet     12/11/16 1112    buPROPion (WELLBUTRIN XL) 300 MG 24 hr tablet  BH-each morning     12/11/16 1112           Lattie HawBeese,  Stephenson Cichy A, MD 12/21/16 [phone]

## 2016-12-11 NOTE — ED Triage Notes (Signed)
Patient complaining of productive cough, worse last night, history of pneumonia, SOB.

## 2017-08-18 ENCOUNTER — Other Ambulatory Visit: Payer: Self-pay | Admitting: Family Medicine

## 2017-08-19 ENCOUNTER — Ambulatory Visit (INDEPENDENT_AMBULATORY_CARE_PROVIDER_SITE_OTHER): Payer: Federal, State, Local not specified - PPO | Admitting: Family Medicine

## 2017-08-19 ENCOUNTER — Encounter: Payer: Self-pay | Admitting: Family Medicine

## 2017-08-19 ENCOUNTER — Other Ambulatory Visit: Payer: Self-pay | Admitting: Family Medicine

## 2017-08-19 VITALS — BP 138/78 | HR 78 | Ht 62.0 in | Wt 142.0 lb

## 2017-08-19 DIAGNOSIS — R030 Elevated blood-pressure reading, without diagnosis of hypertension: Secondary | ICD-10-CM | POA: Diagnosis not present

## 2017-08-19 DIAGNOSIS — R635 Abnormal weight gain: Secondary | ICD-10-CM

## 2017-08-19 DIAGNOSIS — F320 Major depressive disorder, single episode, mild: Secondary | ICD-10-CM | POA: Diagnosis not present

## 2017-08-19 DIAGNOSIS — E559 Vitamin D deficiency, unspecified: Secondary | ICD-10-CM | POA: Diagnosis not present

## 2017-08-19 DIAGNOSIS — Z1239 Encounter for other screening for malignant neoplasm of breast: Secondary | ICD-10-CM

## 2017-08-19 DIAGNOSIS — Z1231 Encounter for screening mammogram for malignant neoplasm of breast: Secondary | ICD-10-CM

## 2017-08-19 MED ORDER — BUPROPION HCL ER (XL) 150 MG PO TB24: 150.0000 mg | ORAL_TABLET | Freq: Every day | ORAL | 1 refills | Status: DC
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
## 2017-08-19 NOTE — Addendum Note (Signed)
Addended by: Nani GasserMETHENEY, Berdell Hostetler D on: 08/19/2017 10:08 AM   Modules accepted: Orders

## 2017-08-19 NOTE — Addendum Note (Signed)
Addended by: Nani GasserMETHENEY, Elexius Minar D on: 08/19/2017 11:49 AM   Modules accepted: Orders

## 2017-08-20 LAB — COMPLETE METABOLIC PANEL WITH GFR
AG RATIO: 2.5 (calc) (ref 1.0–2.5)
ALBUMIN MSPROF: 4.5 g/dL (ref 3.6–5.1)
ALT: 11 U/L (ref 6–29)
AST: 15 U/L (ref 10–35)
Alkaline phosphatase (APISO): 55 U/L (ref 33–130)
BUN: 12 mg/dL (ref 7–25)
CALCIUM: 9.4 mg/dL (ref 8.6–10.4)
CO2: 29 mmol/L (ref 20–32)
CREATININE: 0.81 mg/dL (ref 0.60–0.93)
Chloride: 105 mmol/L (ref 98–110)
GFR, EST AFRICAN AMERICAN: 85 mL/min/{1.73_m2} (ref >=60)
GFR, EST NON AFRICAN AMERICAN: 74 mL/min/{1.73_m2} (ref >=60)
GLOBULIN: 1.8 g/dL — AB (ref 1.9–3.7)
Glucose, Bld: 99 mg/dL (ref 65–139)
POTASSIUM: 4.4 mmol/L (ref 3.5–5.3)
SODIUM: 141 mmol/L (ref 135–146)
Total Bilirubin: 1 mg/dL (ref 0.2–1.2)
Total Protein: 6.3 g/dL (ref 6.1–8.1)

## 2017-08-20 LAB — VITAMIN D 25 HYDROXY (VIT D DEFICIENCY, FRACTURES): VIT D 25 HYDROXY: 11 ng/mL — AB (ref 30–100)

## 2017-08-20 LAB — TSH: TSH: 1.69 m[IU]/L (ref 0.40–4.50)

## 2017-08-20 LAB — LIPID PANEL
CHOL/HDL RATIO: 4.5 (calc) (ref <5.0)
Cholesterol: 254 mg/dL — ABNORMAL HIGH (ref <200)
HDL: 57 mg/dL (ref >50)
LDL Cholesterol (Calc): 150 mg/dL (calc) — ABNORMAL HIGH
NON-HDL CHOLESTEROL (CALC): 197 mg/dL — AB (ref <130)
Triglycerides: 289 mg/dL — ABNORMAL HIGH (ref <150)

## 2017-09-07 ENCOUNTER — Ambulatory Visit (INDEPENDENT_AMBULATORY_CARE_PROVIDER_SITE_OTHER): Payer: Federal, State, Local not specified - PPO

## 2017-09-07 DIAGNOSIS — Z1231 Encounter for screening mammogram for malignant neoplasm of breast: Secondary | ICD-10-CM

## 2017-09-07 DIAGNOSIS — Z1239 Encounter for other screening for malignant neoplasm of breast: Secondary | ICD-10-CM

## 2017-09-26 ENCOUNTER — Ambulatory Visit: Payer: Federal, State, Local not specified - PPO | Admitting: Physician Assistant

## 2017-09-26 ENCOUNTER — Encounter: Payer: Self-pay | Admitting: Physician Assistant

## 2017-09-26 VITALS — BP 162/82 | HR 79 | Ht 62.0 in | Wt 141.0 lb

## 2017-09-26 DIAGNOSIS — G459 Transient cerebral ischemic attack, unspecified: Secondary | ICD-10-CM

## 2017-09-26 DIAGNOSIS — E663 Overweight: Secondary | ICD-10-CM | POA: Diagnosis not present

## 2017-09-26 DIAGNOSIS — F320 Major depressive disorder, single episode, mild: Secondary | ICD-10-CM | POA: Diagnosis not present

## 2017-09-26 DIAGNOSIS — R030 Elevated blood-pressure reading, without diagnosis of hypertension: Secondary | ICD-10-CM

## 2017-09-26 DIAGNOSIS — H938X3 Other specified disorders of ear, bilateral: Secondary | ICD-10-CM

## 2017-09-26 MED ORDER — BUPROPION HCL ER (XL) 300 MG PO TB24: 300.0000 mg | ORAL_TABLET | Freq: Every day | ORAL | 2 refills | Status: AC

## 2017-09-26 MED ORDER — ESCITALOPRAM OXALATE 5 MG PO TABS: 5.0000 mg | ORAL_TABLET | Freq: Every day | ORAL | 2 refills | Status: DC

## 2017-09-26 NOTE — Progress Notes (Signed)
Subjective:    Patient ID: Lori Estes, female    DOB: 1945/09/27, 72 y.o.   MRN: [id number]  HPI Pt is a 72 yo female with hx of HTN, TIA's, Depression who presents to the clinic for follow up after adding wellbutrin. She has tried zoloft in past without good results so wellbutrin was started. She denies any side effects. She does feel like her mood is somewhat better. Denies any SI/HC. She hates living with her alcoholic sister and a lot of stress is associated.  She admits she is not exercising and walking like she should.  She continues to feel live lack of motivation and becomes tearful at times.  She is also sad about her dogs being chronically ill.  It worries her that something might happen to them.  Her blood pressure is elevated today.  She denies any palpitations or chest pains.  She is not taking any medication.  She does have a history of a TIA.  Patient is concerned with her weight gain.  She is gained about 15 to 20 pounds since stopping smoking in December 2018.  She admits to making bad diet decisions.  She also admits to being less active.  She wonders if there is anything we can give her for weight control.  She also mentions bilateral ear pressure and clogged feeling.  At times she feels like water is moving in between sinuses and ears.  She has not done anything to make better.  She denies any history of allergies.   .. Active Ambulatory Problems    Diagnosis Date Noted  . Left hip pain 02/25/2015  . TIA (transient ischemic attack) 02/25/2015  . Vitamin D deficiency 03/06/2015  . Osteopenia 03/06/2015  . Essential hypertension 03/14/2015  . Non-compliance 06/11/2015  . Right low back pain 09/08/2015  . Elevated blood pressure reading 07/15/2016  . Former smoker 07/15/2016  . SOB (shortness of breath) 07/15/2016  . Depression, major, single episode, mild (HCC) 08/19/2017  . Overweight (BMI 25.0-29.9) 09/26/2017   Resolved Ambulatory Problems    Diagnosis Date  Noted  . Diarrhea 07/15/2016   No Additional Past Medical History      Review of Systems  All other systems reviewed and are negative.      Objective:   Physical Exam  Constitutional: She is oriented to person, place, and time. She appears well-developed and well-nourished.  HENT:  Head: Normocephalic and atraumatic.  Right Ear: External ear normal.  Left Ear: External ear normal.  Nose: Nose normal.  Mouth/Throat: Oropharynx is clear and moist.  TM's clear.   Eyes: Conjunctivae and EOM are normal.  Neck: Normal range of motion. Neck supple.  Cardiovascular: Normal rate and regular rhythm.  Pulmonary/Chest: Effort normal and breath sounds normal.  Lymphadenopathy:    She has no cervical adenopathy.  Neurological: She is alert and oriented to person, place, and time.  Psychiatric: Her behavior is normal.  Tearful at times during conversation.           Assessment & Plan:  Marland KitchenMarland KitchenDiagnoses and all orders for this visit:  Elevated blood pressure reading  TIA (transient ischemic attack)  Overweight (BMI 25.0-29.9)  Depression, major, single episode, mild (HCC) -     buPROPion (WELLBUTRIN XL) 300 MG 24 hr tablet; Take 1 tablet (300 mg total) by mouth daily. -     escitalopram (LEXAPRO) 5 MG tablet; Take 1 tablet (5 mg total) by mouth daily.  Ear pressure, bilateral     Depression  screen [facility] DtHQ 2/9 09/26/2017 08/19/2017  Decreased Interest 2 1  Down, Depressed, Hopeless 2 2  PHQ - 2 Score 4 3  Altered sleeping 2 1  Tired, decreased energy 2 2  Change in appetite 3 3  Feeling bad or failure about yourself  2 1  Trouble concentrating 0 0  Moving slowly or fidgety/restless 0 0  Suicidal thoughts 0 0  PHQ-9 Score 13 10  Difficult doing work/chores - Not difficult at all    Rechecked BP and the same. Discussed importance of BP control. Pt will start DASH diet and starting checking BP with home cuff. Follow up in 6 weeks. She states "she does not want to start BP  medication". Pt lives with her alcoholic sister who causes a lot of stress. She is going to live with her daughter for a while and see if that helps with BP. Pt aware of risk of not treating BP especially with hx of TIA's.    PHQ-9 numbers have not improved; however, she does report to "feel" some better. Will add lexapro to wellbutrin and increase wellbutrin. Follow up in 4-6 weeks. Encouraged walking and getting out of the house.   I suspect her ears are from some ETD or fluid trapping. Start flonase 2 sprays each nostril. Follow up as needed.   Marland Kitchen..Spent 30 minutes with patient and greater than 50 percent of visit spent counseling patient regarding treatment plan.

## 2017-09-26 NOTE — Patient Instructions (Addendum)
DASH Eating Plan DASH stands for "Dietary Approaches to Stop Hypertension." The DASH eating plan is a healthy eating plan that has been shown to reduce high blood pressure (hypertension). It may also reduce your risk for type 2 diabetes, heart disease, and stroke. The DASH eating plan may also help with weight loss. What are tips for following this plan? General guidelines  Avoid eating more than 2,300 mg (milligrams) of salt (sodium) a day. If you have hypertension, you may need to reduce your sodium intake to 1,500 mg a day.  Limit alcohol intake to no more than 1 drink a day for nonpregnant women and 2 drinks a day for men. One drink equals 12 oz of beer, 5 oz of wine, or 1 oz of hard liquor.  Work with your health care provider to maintain a healthy body weight or to lose weight. Ask what an ideal weight is for you.  Get at least 30 minutes of exercise that causes your heart to beat faster (aerobic exercise) most days of the week. Activities may include walking, swimming, or biking.  Work with your health care provider or diet and nutrition specialist (dietitian) to adjust your eating plan to your individual calorie needs. Reading food labels  Check food labels for the amount of sodium per serving. Choose foods with less than 5 percent of the Daily Value of sodium. Generally, foods with less than 300 mg of sodium per serving fit into this eating plan.  To find whole grains, look for the word "whole" as the first word in the ingredient list. Shopping  Buy products labeled as "low-sodium" or "no salt added."  Buy fresh foods. Avoid canned foods and premade or frozen meals. Cooking  Avoid adding salt when cooking. Use salt-free seasonings or herbs instead of table salt or sea salt. Check with your health care provider or pharmacist before using salt substitutes.  Do not fry foods. Cook foods using healthy methods such as baking, boiling, grilling, and broiling instead.  Cook with  heart-healthy oils, such as olive, canola, soybean, or sunflower oil. Meal planning   Eat a balanced diet that includes: ? 5 or more servings of fruits and vegetables each day. At each meal, try to fill half of your plate with fruits and vegetables. ? Up to 6-8 servings of whole grains each day. ? Less than 6 oz of lean meat, poultry, or fish each day. A 3-oz serving of meat is about the same size as a deck of cards. One egg equals 1 oz. ? 2 servings of low-fat dairy each day. ? A serving of nuts, seeds, or beans 5 times each week. ? Heart-healthy fats. Healthy fats called Omega-3 fatty acids are found in foods such as flaxseeds and coldwater fish, like sardines, salmon, and mackerel.  Limit how much you eat of the following: ? Canned or prepackaged foods. ? Food that is high in trans fat, such as fried foods. ? Food that is high in saturated fat, such as fatty meat. ? Sweets, desserts, sugary drinks, and other foods with added sugar. ? Full-fat dairy products.  Do not salt foods before eating.  Try to eat at least 2 vegetarian meals each week.  Eat more home-cooked food and less restaurant, buffet, and fast food.  When eating at a restaurant, ask that your food be prepared with less salt or no salt, if possible. What foods are recommended? The items listed may not be a complete list. Talk with your dietitian about what   dietary choices are best for you. Grains Whole-grain or whole-wheat bread. Whole-grain or whole-wheat pasta. Brown rice. Oatmeal. Quinoa. Bulgur. Whole-grain and low-sodium cereals. Pita bread. Low-fat, low-sodium crackers. Whole-wheat flour tortillas. Vegetables Fresh or frozen vegetables (raw, steamed, roasted, or grilled). Low-sodium or reduced-sodium tomato and vegetable juice. Low-sodium or reduced-sodium tomato sauce and tomato paste. Low-sodium or reduced-sodium canned vegetables. Fruits All fresh, dried, or frozen fruit. Canned fruit in natural juice (without  added sugar). Meat and other protein foods Skinless chicken or turkey. Ground chicken or turkey. Pork with fat trimmed off. Fish and seafood. Egg whites. Dried beans, peas, or lentils. Unsalted nuts, nut butters, and seeds. Unsalted canned beans. Lean cuts of beef with fat trimmed off. Low-sodium, lean deli meat. Dairy Low-fat (1%) or fat-free (skim) milk. Fat-free, low-fat, or reduced-fat cheeses. Nonfat, low-sodium ricotta or cottage cheese. Low-fat or nonfat yogurt. Low-fat, low-sodium cheese. Fats and oils Soft margarine without trans fats. Vegetable oil. Low-fat, reduced-fat, or light mayonnaise and salad dressings (reduced-sodium). Canola, safflower, olive, soybean, and sunflower oils. Avocado. Seasoning and other foods Herbs. Spices. Seasoning mixes without salt. Unsalted popcorn and pretzels. Fat-free sweets. What foods are not recommended? The items listed may not be a complete list. Talk with your dietitian about what dietary choices are best for you. Grains Baked goods made with fat, such as croissants, muffins, or some breads. Dry pasta or rice meal packs. Vegetables Creamed or fried vegetables. Vegetables in a cheese sauce. Regular canned vegetables (not low-sodium or reduced-sodium). Regular canned tomato sauce and paste (not low-sodium or reduced-sodium). Regular tomato and vegetable juice (not low-sodium or reduced-sodium). Pickles. Olives. Fruits Canned fruit in a light or heavy syrup. Fried fruit. Fruit in cream or butter sauce. Meat and other protein foods Fatty cuts of meat. Ribs. Fried meat. Bacon. Sausage. Bologna and other processed lunch meats. Salami. Fatback. Hotdogs. Bratwurst. Salted nuts and seeds. Canned beans with added salt. Canned or smoked fish. Whole eggs or egg yolks. Chicken or turkey with skin. Dairy Whole or 2% milk, cream, and half-and-half. Whole or full-fat cream cheese. Whole-fat or sweetened yogurt. Full-fat cheese. Nondairy creamers. Whipped toppings.  Processed cheese and cheese spreads. Fats and oils Butter. Stick margarine. Lard. Shortening. Ghee. Bacon fat. Tropical oils, such as coconut, palm kernel, or palm oil. Seasoning and other foods Salted popcorn and pretzels. Onion salt, garlic salt, seasoned salt, table salt, and sea salt. Worcestershire sauce. Tartar sauce. Barbecue sauce. Teriyaki sauce. Soy sauce, including reduced-sodium. Steak sauce. Canned and packaged gravies. Fish sauce. Oyster sauce. Cocktail sauce. Horseradish that you find on the shelf. Ketchup. Mustard. Meat flavorings and tenderizers. Bouillon cubes. Hot sauce and Tabasco sauce. Premade or packaged marinades. Premade or packaged taco seasonings. Relishes. Regular salad dressings. Where to find more information:  National Heart, Lung, and Blood Institute: www.nhlbi.nih.gov  American Heart Association: www.heart.org Summary  The DASH eating plan is a healthy eating plan that has been shown to reduce high blood pressure (hypertension). It may also reduce your risk for type 2 diabetes, heart disease, and stroke.  With the DASH eating plan, you should limit salt (sodium) intake to 2,300 mg a day. If you have hypertension, you may need to reduce your sodium intake to 1,500 mg a day.  When on the DASH eating plan, aim to eat more fresh fruits and vegetables, whole grains, lean proteins, low-fat dairy, and heart-healthy fats.  Work with your health care provider or diet and nutrition specialist (dietitian) to adjust your eating plan to your individual   calorie needs. This information is not intended to replace advice given to you by your health care provider. Make sure you discuss any questions you have with your health care provider. Document Released: 01/07/2011 Document Revised: 01/12/2016 Document Reviewed: 01/12/2016 Elsevier Interactive Patient Education  2018 [employer]   flonase 2 sprays each nostril once a day.

## 2017-09-27 ENCOUNTER — Encounter: Payer: Self-pay | Admitting: Physician Assistant

## 2018-10-10 IMAGING — DX DG CHEST 2V
2 series · 2 of 2 positions shown · non-contrast
Comparison: July 14, 2016

CLINICAL DATA: Cough and shortness of breath

EXAM:
CHEST  2 VIEW

[chest pa]
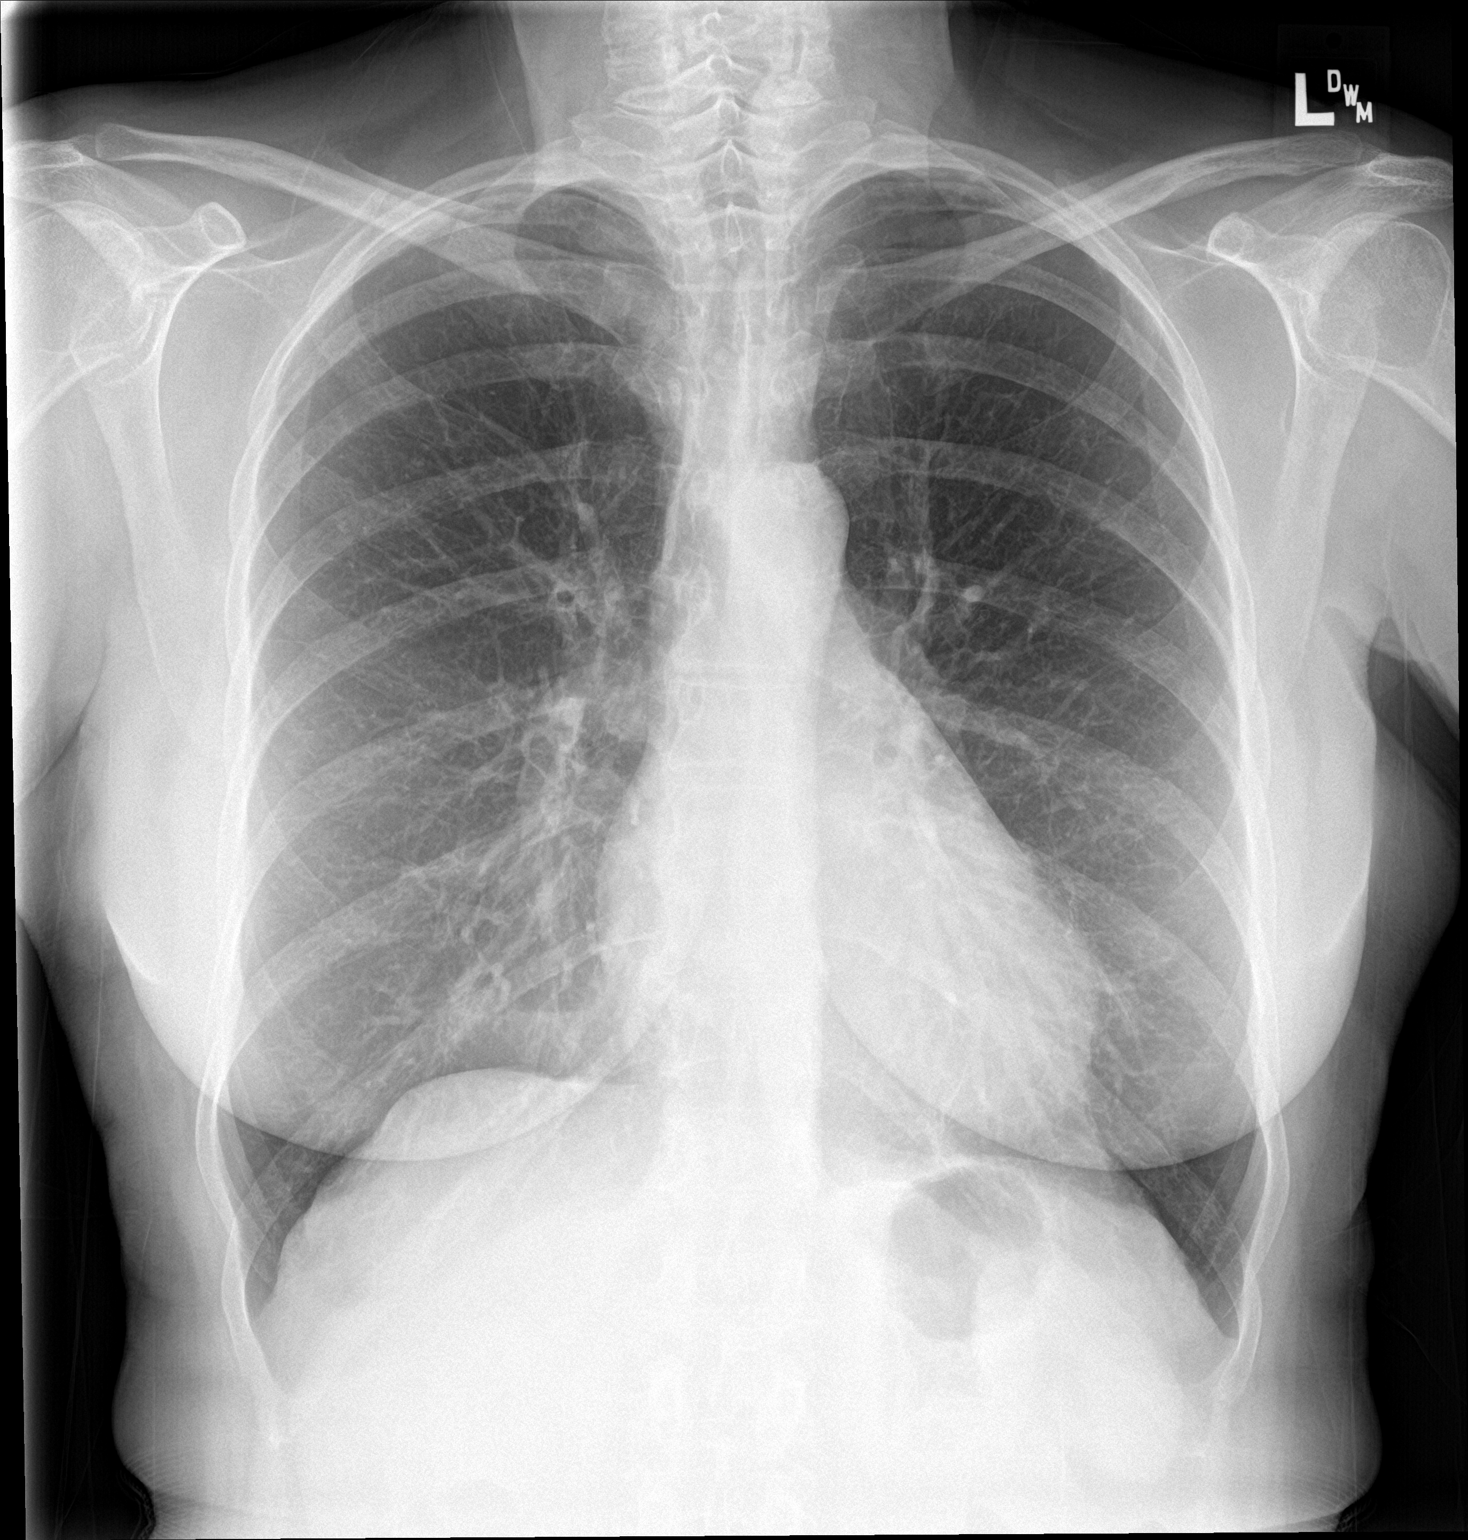

[chest lat]
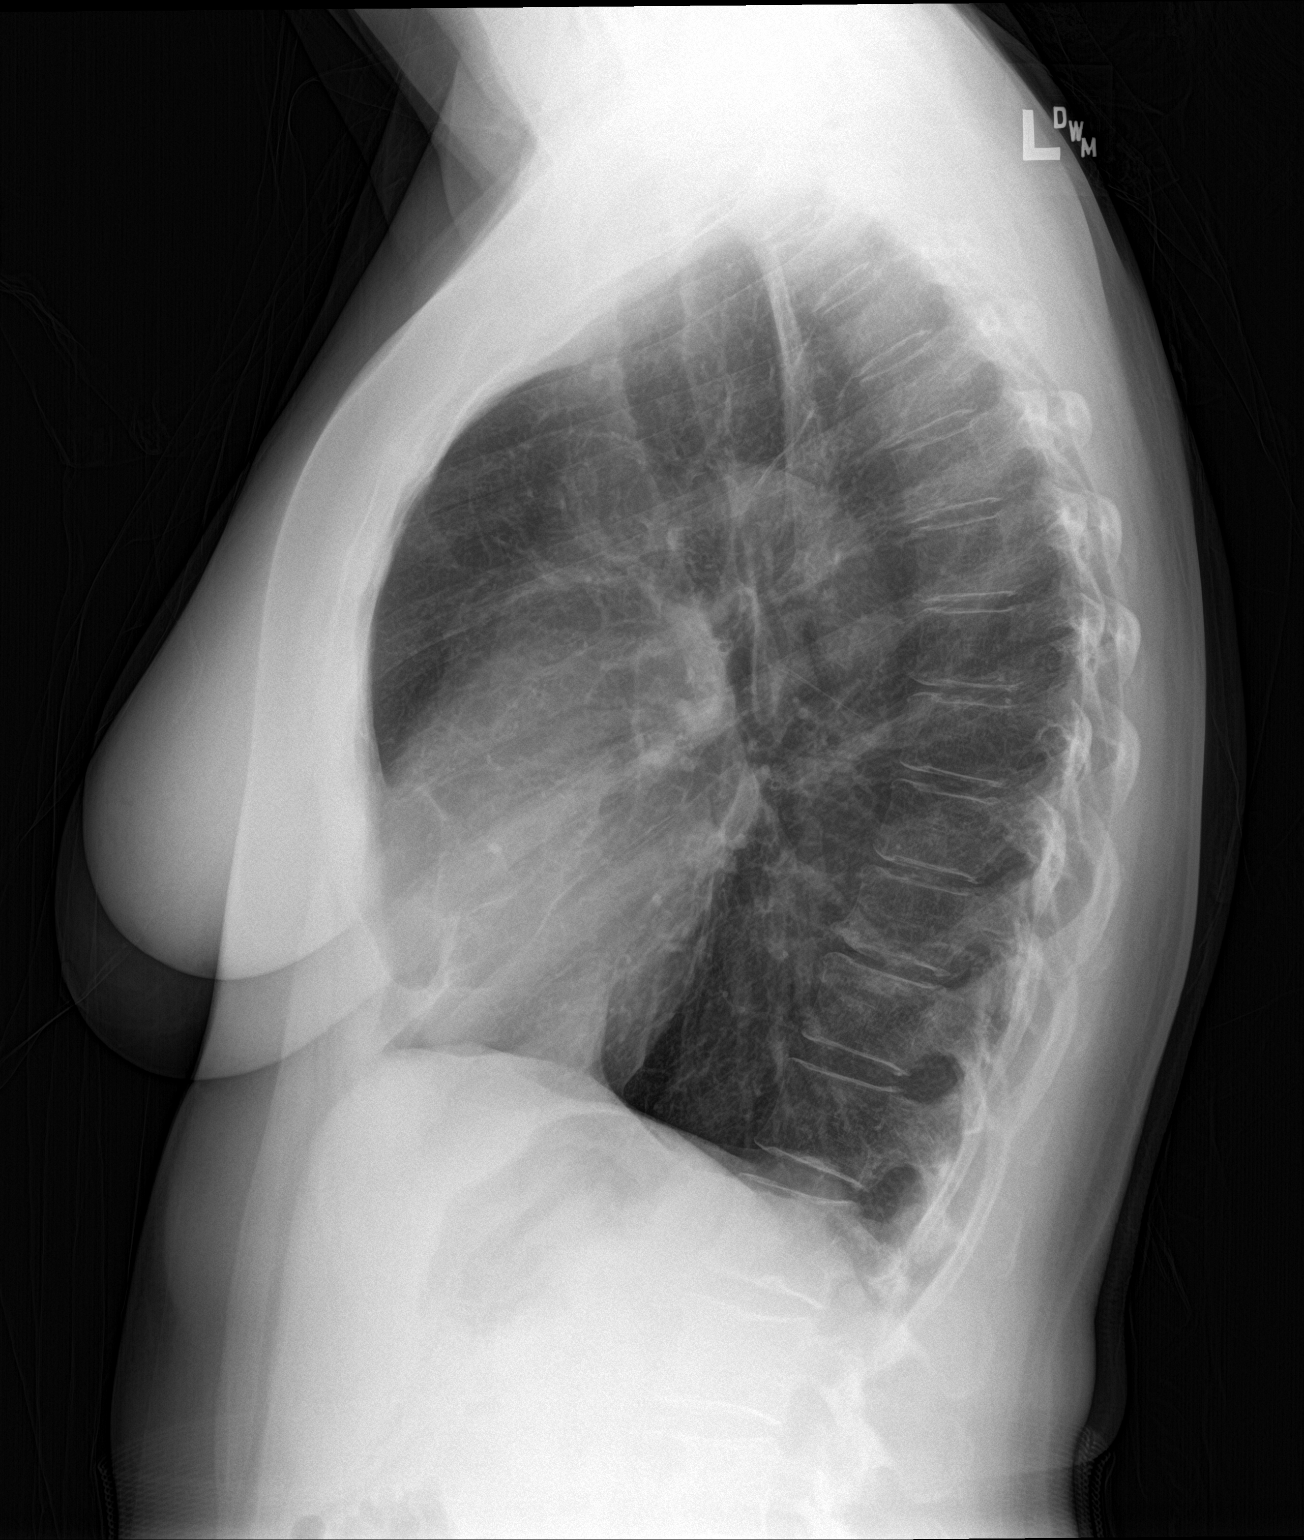

[2 of 2 positions shown; findings below may reference images not displayed]

FINDINGS: Lungs are clear. Heart size and pulmonary vascularity are normal. No
adenopathy. No pneumothorax. No bone lesions.
IMPRESSION: No edema or consolidation.

## 2019-11-16 ENCOUNTER — Telehealth: Payer: Self-pay

## 2019-11-16 ENCOUNTER — Emergency Department (INDEPENDENT_AMBULATORY_CARE_PROVIDER_SITE_OTHER)
Admission: EM | Admit: 2019-11-16 | Discharge: 2019-11-16 | Disposition: A | Discharge status: Home / Self Care | Payer: Federal, State, Local not specified - PPO | Source: Home / Self Care

## 2019-11-16 ENCOUNTER — Other Ambulatory Visit: Payer: Self-pay

## 2019-11-16 DIAGNOSIS — S70361A Insect bite (nonvenomous), right thigh, initial encounter: Secondary | ICD-10-CM

## 2019-11-16 DIAGNOSIS — W57XXXA Bitten or stung by nonvenomous insect and other nonvenomous arthropods, initial encounter: Secondary | ICD-10-CM

## 2019-11-16 DIAGNOSIS — L089 Local infection of the skin and subcutaneous tissue, unspecified: Secondary | ICD-10-CM | POA: Diagnosis not present

## 2019-11-16 MED ORDER — AMOXICILLIN 875 MG PO TABS: 875.0000 mg | ORAL_TABLET | Freq: Two times a day (BID) | ORAL | 0 refills | Status: AC

## 2019-11-16 NOTE — Telephone Encounter (Signed)
RX called into pharmacy due to problems with escribe.

## 2019-11-16 NOTE — ED Provider Notes (Signed)
[facility]    CSN: [id number] Arrival date & time: 11/16/19  1113      History   Chief Complaint Chief Complaint  Patient presents with  . Tick bite    HPI Lori Estes is a 74 y.o. female.   HPI Patient presents today for evaluation of a bite wound in which she incurred from a tick she removed yesterday.  Patient is concerned as she sustained a tick bite several years ago that resulted in a severe cellulitis type infection.  The current area in which the tick bit her is tender and reddened. The area is very localized.  She denies any nausea, vomiting, chills or fever. Past Medical History:  Diagnosis Date  . Osteopenia 03/06/2015   Outside records reflect raloxifene 60mg      Patient Active Problem List   Diagnosis Date Noted  . Overweight (BMI 25.0-29.9) 09/26/2017  . Depression, major, single episode, mild (HCC) 08/19/2017  . Elevated blood pressure reading 07/15/2016  . Former smoker 07/15/2016  . SOB (shortness of breath) 07/15/2016  . Right low back pain 09/08/2015  . Non-compliance 06/11/2015  . Essential hypertension 03/14/2015  . Vitamin D deficiency 03/06/2015  . Osteopenia 03/06/2015  . Left hip pain 02/25/2015  . TIA (transient ischemic attack) 02/25/2015    History reviewed. No pertinent surgical history.  OB History   No obstetric history on file.      Home Medications    Prior to Admission medications   Medication Sig Start Date End Date Taking? Authorizing Provider  aspirin EC 81 MG tablet Take 81 mg by mouth daily.    [provider]  buPROPion (WELLBUTRIN XL) 300 MG 24 hr tablet Take 1 tablet (300 mg total) by mouth daily. 09/26/17   Breeback, Jade L, PA-C  escitalopram (LEXAPRO) 5 MG tablet Take 1 tablet (5 mg total) by mouth daily. 09/26/17   Breeback, Jade L, PA-C  Ipratropium-Albuterol (COMBIVENT) 20-100 MCG/ACT AERS respimat Inhale 1 puff every 6 (six) hours as needed into the lungs. 12/11/16   13/10/18, MD     Family History Family History  Problem Relation Age of Onset  . Alcohol abuse Sister     Social History Social History   Tobacco Use  . Smoking status: Current Every Day Smoker    Packs/day: 0.50    Types: Cigarettes  . Smokeless tobacco: Never Used  Vaping Use  . Vaping Use: Never used  Substance Use Topics  . Alcohol use: Yes    Alcohol/week: 10.0 standard drinks    Types: 10 Cans of beer per week    Comment: 4 drinks weekly  . Drug use: Not on file     Allergies   Ceftin [cefuroxime axetil] and Doxycycline   Review of Systems Review of Systems Pertinent negatives listed in HPI Physical Exam Triage Vital Signs ED Triage Vitals  Enc Vitals Group     BP 11/16/19 1157 (!) 168/81     Pulse Rate 11/16/19 1157 80     Resp 11/16/19 1157 18     Temp 11/16/19 1157 98.1 F (36.7 C)     Temp Source 11/16/19 1157 Oral     SpO2 11/16/19 1157 95 %     Weight --      Height --      Head Circumference --      Peak Flow --      Pain Score 11/16/19 1159 0     Pain Loc --  Pain Edu? --      Excl. in GC? --    No data found.  Updated Vital Signs BP (!) 168/81 (BP Location: Right Arm)   Pulse 80   Temp 98.1 F (36.7 C) (Oral)   Resp 18   SpO2 95%   Visual Acuity Right Eye Distance:   Left Eye Distance:   Bilateral Distance:    Right Eye Near:   Left Eye Near:    Bilateral Near:     Physical Exam Constitutional:      Appearance: Normal appearance.  HENT:     Head: Normocephalic.  Cardiovascular:     Rate and Rhythm: Normal rate and regular rhythm.  Skin:      Neurological:     General: No focal deficit present.     Mental Status: She is alert and oriented to person, place, and time.  Psychiatric:        Mood and Affect: Mood normal.      UC Treatments / Results  Labs (all labs ordered are listed, but only abnormal results are displayed) Labs Reviewed - No data to display  EKG   Radiology No results  found.  Procedures Procedures (including critical care time)  Medications Ordered in UC Medications - No data to display  Initial Impression / Assessment and Plan / UC Course  I have reviewed the triage vital signs and the nursing notes.  Pertinent labs & imaging results that were available during my care of the patient were reviewed by me and considered in my medical decision making (see chart for details).      Covering for an acute superficial skin infection related to a tick bite with amoxicillin 875 twice daily x14 days.  Patient has multiple allergies and per up-to-date you can cover for up to 14 days for protection against Lyme disease. Areas marked for patient to monitor and to follow-up if any other red flag symptoms we discussed occur.  Patient verbalized understanding and agreement with plan. Final Clinical Impressions(s) / UC Diagnoses   Final diagnoses:  Skin infection  Insect bite of right thigh, initial encounter     Discharge Instructions     Continue to monitor for signs of infection.  If any redness, pus, swelling develops return immediately for evaluation. Complete all medication as prescribed.    ED Prescriptions    Medication Sig Dispense Auth. Provider   amoxicillin (AMOXIL) 875 MG tablet Take 1 tablet (875 mg total) by mouth 2 (two) times daily for 14 days. 28 tablet Bing Neighbors, FNP     PDMP not reviewed this encounter.   Bing Neighbors, FNP 11/16/19 [phone]

## 2019-11-16 NOTE — ED Triage Notes (Signed)
Pt c/o tick bite since yesterday. Noticed tick around 8pm last night. Removed by self using peppermint oil. Redness noted.

## 2019-11-16 NOTE — Discharge Instructions (Addendum)
Continue to monitor for signs of infection.  If any redness, pus, swelling develops return immediately for evaluation. Complete all medication as prescribed.

## 2020-02-13 LAB — HM MAMMOGRAPHY

## 2020-03-04 DEATH — deceased

## 2020-10-18 ENCOUNTER — Encounter: Payer: Self-pay | Admitting: Emergency Medicine

## 2020-10-18 ENCOUNTER — Other Ambulatory Visit: Payer: Self-pay

## 2020-10-18 ENCOUNTER — Emergency Department
Admission: EM | Admit: 2020-10-18 | Discharge: 2020-10-18 | Disposition: A | Discharge status: Home / Self Care | Payer: Federal, State, Local not specified - PPO | Source: Home / Self Care | Attending: Family Medicine | Admitting: Family Medicine

## 2020-10-18 DIAGNOSIS — J209 Acute bronchitis, unspecified: Secondary | ICD-10-CM | POA: Diagnosis not present

## 2020-10-18 DIAGNOSIS — R062 Wheezing: Secondary | ICD-10-CM

## 2020-10-18 DIAGNOSIS — F17200 Nicotine dependence, unspecified, uncomplicated: Secondary | ICD-10-CM | POA: Diagnosis not present

## 2020-10-18 DIAGNOSIS — E785 Hyperlipidemia, unspecified: Secondary | ICD-10-CM | POA: Insufficient documentation

## 2020-10-18 MED ORDER — AZITHROMYCIN 250 MG PO TABS: 250.0000 mg | ORAL_TABLET | Freq: Every day | ORAL | 0 refills | Status: DC

## 2020-10-18 MED ORDER — ALBUTEROL SULFATE HFA 108 (90 BASE) MCG/ACT IN AERS
2.0000 | INHALATION_SPRAY | Freq: Once | RESPIRATORY_TRACT | Status: AC
  Administered 2020-10-18: 2 via RESPIRATORY_TRACT

## 2020-10-18 MED ORDER — PREDNISONE 20 MG PO TABS: 20.0000 mg | ORAL_TABLET | Freq: Two times a day (BID) | ORAL | 0 refills | Status: DC

## 2020-10-18 MED ORDER — IPRATROPIUM-ALBUTEROL 20-100 MCG/ACT IN AERS: 1.0000 | INHALATION_SPRAY | Freq: Four times a day (QID) | RESPIRATORY_TRACT | 0 refills | Status: DC

## 2020-10-18 MED ORDER — METHYLPREDNISOLONE SODIUM SUCC 125 MG IJ SOLR
80.0000 mg | Freq: Once | INTRAMUSCULAR | Status: AC
  Administered 2020-10-18: 80 mg via INTRAMUSCULAR

## 2020-10-18 NOTE — ED Triage Notes (Signed)
Patient states she's having a lot of congestion, difficulty breathing, productive cough for a couple days.  Patient is vaccinated for COVID.

## 2020-10-18 NOTE — Discharge Instructions (Signed)
Drink lots of fluids Take antibiotic as directed Take prednisone 2 times a day for 5 days Use the Combivent inhaler instead of albuterol See your doctor next week for follow-up Go to ER if worse instead of better

## 2020-10-19 NOTE — ED Provider Notes (Signed)
[facility]    CSN: [id number] Arrival date & time: 10/18/20  1225      History   Chief Complaint Chief Complaint  Patient presents with   Nasal Congestion    HPI Lori Estes is a 75 y.o. female.   HPI Patient is here complaining of cough and congestion.  This has been going on for several days.  She is having more shortness of breath and states she is having difficulty breathing.  Upon arrival her respiratory rate was 20 and SPO2 95%.  Patient states she does not have a history of asthma.  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 No pertinent surgical history.  OB History   No obstetric history on file.      Home Medications    Prior to Admission medications   Medication Sig Start Date End Date Taking? Authorizing Provider  aspirin EC 81 MG tablet Take 81 mg by mouth daily.   Yes [provider]  azithromycin (ZITHROMAX) 250 MG tablet Take 1 tablet (250 mg total) by mouth daily. Take first 2 tablets together, then 1 every day until finished. 10/18/20  Yes 10/20/20, MD  buPROPion (WELLBUTRIN XL) 300 MG 24 hr tablet Take 1 tablet (300 mg total) by mouth daily. 09/26/17  Yes Breeback, Jade L, PA-C  escitalopram (LEXAPRO) 5 MG tablet Take 1 tablet (5 mg total) by mouth daily. 09/26/17  Yes Breeback, Jade L, PA-C  Ipratropium-Albuterol (COMBIVENT) 20-100 MCG/ACT AERS respimat Inhale 1 puff into the lungs every 6 (six) hours. 10/18/20  Yes 10/20/20, MD   predniSONE (DELTASONE) 20 MG tablet Take 1 tablet (20 mg total) by mouth 2 (two) times daily with a meal. 10/18/20  Yes 10/20/20, MD    Family History Family History  Problem Relation Age of Onset   Alcohol abuse Sister     Social History Social History   Tobacco Use   Smoking status: Every Day    Packs/day: 0.50    Types: Cigarettes   Smokeless tobacco: Never  Vaping Use   Vaping Use: Never used  Substance Use Topics   Alcohol use: Yes    Alcohol/week: 10.0 standard drinks    Types: 10 Cans of beer per week    Comment: 4 drinks weekly     Allergies   Ceftin [cefuroxime axetil] and Doxycycline   Review of Systems Review of Systems See HPI  Physical Exam Triage Vital Signs ED Triage Vitals  Enc Vitals Group     BP 10/18/20 1308 (!) 170/75     Pulse Rate 10/18/20 1308 80     Resp 10/18/20 1308 20     Temp 10/18/20 1308 98.7 F (37.1 C)     Temp Source 10/18/20 1308 Oral     SpO2 10/18/20 1308 95 %     Weight 10/18/20 1310 118 lb (53.5 kg)     Height 10/18/20 1310 5\' 2"  (1.575 m)     Head Circumference --  Peak Flow --      Pain Score 10/18/20 1309 0     Pain Loc --      Pain Edu? --      Excl. in GC? --    No data found.  Updated Vital Signs BP (!) 170/75 (BP Location: Right Arm)   Pulse 80   Temp 98.7 F (37.1 C) (Oral)   Resp 20   Ht 5\' 2"  (1.575 m)   Wt 53.5 kg   SpO2 95%   BMI 21.58 kg/m       Physical Exam Constitutional:      General: She is not in acute distress.    Appearance: She is well-developed.  HENT:     Head: Normocephalic and atraumatic.     Right Ear: Tympanic membrane, ear canal and external ear normal.     Left Ear: Tympanic membrane, ear canal and external ear normal.     Nose: Nose normal. No congestion.     Mouth/Throat:     Mouth: Mucous membranes are moist.     Pharynx: No posterior oropharyngeal erythema.  Eyes:     Conjunctiva/sclera: Conjunctivae normal.     Pupils: Pupils are equal, round, and  reactive to light.  Cardiovascular:     Rate and Rhythm: Normal rate and regular rhythm.     Heart sounds: Normal heart sounds.  Pulmonary:     Effort: Pulmonary effort is normal. No respiratory distress.     Breath sounds: Wheezing and rhonchi present.     Comments: Patient has inspiratory wheeze throughout.  After Solu-Medrol and albuterol her wheezing is improved but not absent.  Patient states she feels much better Abdominal:     General: There is no distension.     Palpations: Abdomen is soft.  Musculoskeletal:        General: Normal range of motion.     Cervical back: Normal range of motion.  Lymphadenopathy:     Cervical: No cervical adenopathy.  Skin:    General: Skin is warm and dry.  Neurological:     Mental Status: She is alert.     UC Treatments / Results  Labs (all labs ordered are listed, but only abnormal results are displayed) Labs Reviewed - No data to display  EKG   Radiology No results found.  Procedures Procedures (including critical care time)  Medications Ordered in UC Medications  methylPREDNISolone sodium succinate (SOLU-MEDROL) 125 mg/2 mL injection 80 mg (80 mg Intramuscular Given 10/18/20 1342)  albuterol (VENTOLIN HFA) 108 (90 Base) MCG/ACT inhaler 2 puff (2 puffs Inhalation Given 10/18/20 1344)    Initial Impression / Assessment and Plan / UC Course  I have reviewed the triage vital signs and the nursing notes.  Pertinent labs & imaging results that were available during my care of the patient were reviewed by me and considered in my medical decision making (see chart for details).     Upper respiratory infection with wheezing and smoker.  Suspect early emphysema.  Will cover with antibiotics and prednisone as well as a Combivent inhaler.  Patient states she has used albuterol so much that it no longer works well for her.  We discussed she needs to quit smoking.  Follow-up with primary care Final Clinical Impressions(s) / UC Diagnoses    Final diagnoses:  Inspiratory wheeze on examination  Acute bronchitis, unspecified organism  Tobacco dependence     Discharge Instructions      Drink lots of fluids Take antibiotic as directed  Take prednisone 2 times a day for 5 days Use the Combivent inhaler instead of albuterol See your doctor next week for follow-up Go to ER if worse instead of better   ED Prescriptions     Medication Sig Dispense Auth. Provider   azithromycin (ZITHROMAX) 250 MG tablet Take 1 tablet (250 mg total) by mouth daily. Take first 2 tablets together, then 1 every day until finished. 6 tablet Eustace Moore, MD   Ipratropium-Albuterol (COMBIVENT) 20-100 MCG/ACT AERS respimat Inhale 1 puff into the lungs every 6 (six) hours. 4 g Eustace Moore, MD   predniSONE (DELTASONE) 20 MG tablet Take 1 tablet (20 mg total) by mouth 2 (two) times daily with a meal. 10 tablet Eustace Moore, MD      PDMP not reviewed this encounter.   Eustace Moore, MD 10/19/20 1350

## 2020-10-23 ENCOUNTER — Telehealth: Payer: Self-pay | Admitting: Emergency Medicine

## 2020-10-23 MED ORDER — COMBIVENT RESPIMAT 20-100 MCG/ACT IN AERS: 1.0000 | INHALATION_SPRAY | Freq: Four times a day (QID) | RESPIRATORY_TRACT | 0 refills | Status: DC

## 2020-10-23 NOTE — Telephone Encounter (Signed)
Call back to [facility] regarding request for combi-vent to be sent to [facility] on [address] - pt learned today that she can no longer use [facility]. Request sent to Dr Delton See.

## 2021-06-10 LAB — EXTERNAL GENERIC LAB PROCEDURE: COLOGUARD: POSITIVE — AB

## 2022-06-16 ENCOUNTER — Ambulatory Visit
Admission: EM | Admit: 2022-06-16 | Discharge: 2022-06-16 | Disposition: A | Discharge status: Home / Self Care | Payer: Federal, State, Local not specified - PPO

## 2022-06-16 ENCOUNTER — Encounter: Payer: Self-pay | Admitting: Emergency Medicine

## 2022-06-16 DIAGNOSIS — J4541 Moderate persistent asthma with (acute) exacerbation: Secondary | ICD-10-CM | POA: Diagnosis not present

## 2022-06-16 MED ORDER — METHYLPREDNISOLONE SODIUM SUCC 125 MG IJ SOLR: 125.0000 mg | Freq: Once | INTRAMUSCULAR | Status: DC

## 2022-06-16 MED ORDER — ALBUTEROL SULFATE HFA 108 (90 BASE) MCG/ACT IN AERS: 2.0000 | INHALATION_SPRAY | RESPIRATORY_TRACT | 1 refills | Status: DC | PRN

## 2022-06-16 MED ORDER — DEXAMETHASONE SODIUM PHOSPHATE 10 MG/ML IJ SOLN
10.0000 mg | Freq: Once | INTRAMUSCULAR | Status: AC
  Administered 2022-06-16: 10 mg via INTRAMUSCULAR

## 2022-06-16 MED ORDER — IPRATROPIUM-ALBUTEROL 0.5-2.5 (3) MG/3ML IN SOLN
3.0000 mL | Freq: Once | RESPIRATORY_TRACT | Status: AC
  Administered 2022-06-16: 3 mL via RESPIRATORY_TRACT

## 2022-06-16 NOTE — Discharge Instructions (Addendum)
Use inhaler 2 puffs every 4 hours.  Zyrtec for allergy symptoms

## 2022-06-16 NOTE — ED Triage Notes (Signed)
Pt was clearing off a porch and doing yard work  Pt has pollen allergies  Sinus congestion w/ cough x 2 days

## 2022-06-18 NOTE — ED Provider Notes (Signed)
[facility]    CSN: [id number] Arrival date & time: 06/16/22  1549      History   Chief Complaint Chief Complaint  Patient presents with   Cough    HPI Lori Estes is a 77 y.o. female.   Patient complains of a cough and congestion.  Patient reports she began feeling short of breath.  Patient reports symptoms began after cleaning pollen off of a porch.  Patient reports that she has a history of COPD.  Patient is a former smoker.  She denies any fever or chills  The history is provided by the patient. No language interpreter was used.  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 No pertinent surgical history.  OB History   No obstetric history on file.      Home Medications    Prior to Admission medications   Medication Sig Start Date End Date Taking? Authorizing Provider  mometasone-formoterol (DULERA) 100-5 MCG/ACT AERO Inhale into the lungs. 10/22/20  Yes [provider]  albuterol (VENTOLIN HFA) 108 (90 Base) MCG/ACT inhaler Inhale 2 puffs into the lungs every 4 (four) hours as needed for wheezing or shortness of breath. 06/16/22   Elson Areas, PA-C  aspirin EC 81 MG tablet Take 81 mg by mouth daily.    [provider]  azithromycin (ZITHROMAX) 250 MG tablet Take 1 tablet (250 mg total) by mouth daily.  Take first 2 tablets together, then 1 every day until finished. Patient not taking: Reported on 06/16/2022 10/18/20   Eustace Moore, MD  buPROPion (WELLBUTRIN XL) 300 MG 24 hr tablet Take 1 tablet (300 mg total) by mouth daily. Patient not taking: Reported on 06/16/2022 09/26/17   Tandy Gaw L, PA-C  escitalopram (LEXAPRO) 5 MG tablet Take 1 tablet (5 mg total) by mouth daily. Patient not taking: Reported on 06/16/2022 09/26/17   Jomarie Longs, PA-C  Ipratropium-Albuterol (COMBIVENT RESPIMAT) 20-100 MCG/ACT AERS respimat Inhale 1 puff into the lungs every 6 (six) hours. Patient not taking: Reported on 06/16/2022 10/23/20   Eustace Moore, MD  Ipratropium-Albuterol (COMBIVENT) 20-100 MCG/ACT AERS respimat Inhale 1 puff into the lungs every 6 (six) hours. Patient not taking: Reported on 06/16/2022 10/18/20   Eustace Moore, MD    Family History Family History  Problem Relation Age of Onset   Alcohol abuse Sister     Social History Social History   Tobacco Use   Smoking status: Every Day    Packs/day: 0.50    Years: 60.00    Additional pack years: 0.00    Total pack years: 30.00    Types: Cigarettes   Smokeless tobacco: Never  Vaping Use   Vaping Use: Never used  Substance Use Topics   Alcohol use: Yes    Alcohol/week: 3.0 standard drinks of alcohol    Types: 3 Cans of beer per week   Drug  use: Never     Allergies   Ceftin [cefuroxime axetil] and Doxycycline   Review of Systems Review of Systems  Respiratory:  Positive for cough.   All other systems reviewed and are negative.    Physical Exam Triage Vital Signs ED Triage Vitals  Enc Vitals Group     BP 06/16/22 1601 (!) 147/90     Pulse Rate 06/16/22 1601 (!) 108     Resp 06/16/22 1601 18     Temp 06/16/22 1601 98.4 F (36.9 C)     Temp Source 06/16/22 1601 Oral     SpO2 06/16/22 1601 97 %     Weight 06/16/22 1604 117 lb 15.1 oz (53.5 kg)     Height 06/16/22 1604 5\' 2"  (1.575 m)     Head  Circumference --      Peak Flow --      Pain Score 06/16/22 1604 0     Pain Loc --      Pain Edu? --      Excl. in GC? --    No data found.  Updated Vital Signs BP (!) 147/90 (BP Location: Left Arm)   Pulse (!) 108   Temp 98.4 F (36.9 C) (Oral)   Resp 18   Ht 5\' 2"  (1.575 m)   Wt 53.5 kg   SpO2 97%   BMI 21.57 kg/m   Visual Acuity Right Eye Distance:   Left Eye Distance:   Bilateral Distance:    Right Eye Near:   Left Eye Near:    Bilateral Near:     Physical Exam Vitals and nursing note reviewed.  Constitutional:      Appearance: She is well-developed.  HENT:     Head: Normocephalic.  Cardiovascular:     Rate and Rhythm: Normal rate.  Pulmonary:     Breath sounds: Wheezing present.  Abdominal:     General: There is no distension.  Musculoskeletal:        General: Normal range of motion.     Cervical back: Normal range of motion.  Neurological:     Mental Status: She is alert and oriented to person, place, and time.  Psychiatric:        Mood and Affect: Mood normal.      UC Treatments / Results  Labs (all labs ordered are listed, but only abnormal results are displayed) Labs Reviewed - No data to display  EKG   Radiology No results found.  Procedures Procedures (including critical care time)  Medications Ordered in UC Medications  ipratropium-albuterol (DUONEB) 0.5-2.5 (3) MG/3ML nebulizer solution 3 mL (3 mLs Nebulization Given 06/16/22 1638)  dexamethasone (DECADRON) injection 10 mg (10 mg Intramuscular Given 06/16/22 1652)    Initial Impression / Assessment and Plan / UC Course  I have reviewed the triage vital signs and the nursing notes.  Pertinent labs & imaging results that were available during my care of the patient were reviewed by me and considered in my medical decision making (see chart for details).     Patient given DuoNeb and Solu-Medrol.  Patient is given a prescription for albuterol.  Patient is advised to return if  symptoms worsen or change Final Clinical Impressions(s) / UC Diagnoses   Final diagnoses:  Moderate persistent asthma with exacerbation     Discharge Instructions      Use inhaler 2 puffs every 4 hours.  Zyrtec for allergy symptoms    ED Prescriptions     Medication Sig Dispense Auth.  Provider   albuterol (VENTOLIN HFA) 108 (90 Base) MCG/ACT inhaler Inhale 2 puffs into the lungs every 4 (four) hours as needed for wheezing or shortness of breath. 1 each Elson Areas, PA-C      PDMP not reviewed this encounter. An After Visit Summary was printed and given to the patient.       Elson Areas, [place] 06/18/22 1701

## 2022-07-30 ENCOUNTER — Ambulatory Visit
Admission: EM | Admit: 2022-07-30 | Discharge: 2022-07-30 | Disposition: A | Discharge status: Home / Self Care | Payer: Federal, State, Local not specified - PPO | Attending: Family Medicine | Admitting: Family Medicine

## 2022-07-30 DIAGNOSIS — L509 Urticaria, unspecified: Secondary | ICD-10-CM | POA: Diagnosis not present

## 2022-07-30 DIAGNOSIS — R21 Rash and other nonspecific skin eruption: Secondary | ICD-10-CM | POA: Diagnosis not present

## 2022-07-30 MED ORDER — FEXOFENADINE HCL 180 MG PO TABS: 180.0000 mg | ORAL_TABLET | Freq: Every day | ORAL | 0 refills | Status: DC

## 2022-07-30 MED ORDER — METHYLPREDNISOLONE ACETATE 80 MG/ML IJ SUSP
80.0000 mg | Freq: Once | INTRAMUSCULAR | Status: AC
  Administered 2022-07-30: 80 mg via INTRAMUSCULAR

## 2022-07-30 NOTE — Discharge Instructions (Addendum)
Advised patient to take Allegra daily for the next 5 days.  Encouraged increase daily water intake to 64 ounces per day while taking this medication.  Advised if symptoms worsen and/or unresolved please follow-up with PCP or here for further evaluation.

## 2022-07-30 NOTE — ED Triage Notes (Signed)
Pt c/o rash that she noticed this am, to both underarms. Says it burns slightly. No new detergents or skin products.

## 2022-07-30 NOTE — ED Provider Notes (Signed)
[facility]    CSN: [id number] Arrival date & time: 07/30/22  1414      History   Chief Complaint Chief Complaint  Patient presents with   Rash    HPI Lori Estes is a 77 y.o. female.   HPI Pleasant 77 year old female presents with a rash of axilla noticed first thing this morning, which slightly burns per patient.  Patient reports trying on coats and other clothes yesterday at the [facility].  PMH significant for TIA, HTN, and HLD.  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 No pertinent surgical history.  OB History   No obstetric history on file.      Home Medications    Prior to Admission medications   Medication Sig Start Date End Date Taking? Authorizing Provider  fexofenadine (ALLEGRA ALLERGY) 180 MG tablet Take 1 tablet (180 mg total) by mouth daily for 15 days. 07/30/22 08/14/22 Yes Trevor Iha, FNP  albuterol (VENTOLIN HFA) 108 (90 Base) MCG/ACT inhaler Inhale 2 puffs into the lungs every 4 (four) hours as needed for wheezing or shortness of breath. 06/16/22   Elson Areas, PA-C  aspirin EC 81 MG tablet Take 81 mg by mouth daily.    [provider]  buPROPion (WELLBUTRIN XL) 300 MG 24 hr tablet Take 1 tablet (300 mg total) by mouth daily. 09/26/17   Breeback, Jade L, PA-C  mometasone-formoterol (DULERA) 100-5 MCG/ACT AERO Inhale into the lungs. 10/22/20   [provider]    Family History Family History  Problem Relation Age of Onset   Alcohol abuse Sister      Social History Social History   Tobacco Use   Smoking status: Every Day    Packs/day: 0.50    Years: 60.00    Additional pack years: 0.00    Total pack years: 30.00    Types: Cigarettes   Smokeless tobacco: Never  Vaping Use   Vaping Use: Never used  Substance Use Topics   Alcohol use: Yes    Alcohol/week: 3.0 standard drinks of alcohol    Types: 3 Cans of beer per week   Drug use: Never     Allergies   Ceftin [cefuroxime axetil] and Doxycycline   Review of Systems Review of Systems  Skin:  Positive for rash.  All other systems reviewed and are negative.    Physical Exam Triage Vital Signs ED Triage Vitals [07/30/22 1423]  Enc Vitals Group     BP (!) 185/94     Pulse Rate (!) 107     Resp 18     Temp      Temp Source Oral     SpO2 96 %     Weight      Height      Head Circumference      Peak Flow      Pain Score 0     Pain Loc      Pain Edu?      Excl. in GC?    No data found.  Updated  Vital Signs BP (!) 159/93   Pulse (!) 107   Resp 18   SpO2 96%   Physical Exam Vitals and nursing note reviewed.  Constitutional:      General: She is not in acute distress.    Appearance: Normal appearance. She is normal weight. She is not ill-appearing.  HENT:     Head: Normocephalic and atraumatic.     Mouth/Throat:     Mouth: Mucous membranes are moist.     Pharynx: Oropharynx is clear.  Eyes:     Extraocular Movements: Extraocular movements intact.     Conjunctiva/sclera: Conjunctivae normal.     Pupils: Pupils are equal, round, and reactive to light.  Cardiovascular:     Rate and Rhythm: Normal rate and regular rhythm.     Pulses: Normal pulses.     Heart sounds: Normal heart sounds.  Pulmonary:     Effort: Pulmonary effort is normal.     Breath sounds: Normal breath sounds. No wheezing, rhonchi or rales.  Musculoskeletal:        General: Normal range of motion.     Cervical back: Normal range of motion and neck supple.  Skin:    General:  Skin is warm and dry.     Comments: Left axilla (inferior aspect): Pruritic erythematous plaques with well demarcated erythematous borders resembling hives-please see images below  Right shoulder (anterior aspect): erythematous grouped papules noted-see image below  Neurological:     General: No focal deficit present.     Mental Status: She is alert and oriented to person, place, and time. Mental status is at baseline.  Psychiatric:        Mood and Affect: Mood normal.        Behavior: Behavior normal.         UC Treatments / Results  Labs (all labs ordered are listed, but only abnormal results are displayed) Labs Reviewed - No data to display  EKG   Radiology No results found.  Procedures Procedures (including critical care time)  Medications Ordered in UC Medications  methylPREDNISolone acetate (DEPO-MEDROL) injection 80 mg (80 mg Intramuscular Given 07/30/22 1447)    Initial Impression / Assessment and Plan / UC Course  I have reviewed the triage vital signs and the nursing notes.  Pertinent labs & imaging results that were available during my care of the patient were reviewed by me and considered in my medical decision making (see chart for details).     MDM: 1.  Rash and nonspecific skin eruption-IM Depo-Medrol 80 mg given once in clinic; 2.  Urticaria-Rx'd Allegra 180 mg fexofenadine daily for the next 5 days. Advised patient to take Allegra daily for the next 5 days.  Encouraged increase daily water intake to 64 ounces per day while taking this medication.  Advised if symptoms worsen and/or unresolved please follow-up with PCP or here for further evaluation.  Patient discharged home, hemodynamically stable. Final Clinical Impressions(s) / UC Diagnoses   Final diagnoses:  Rash and nonspecific skin eruption  Urticaria     Discharge Instructions      Advised patient to take Allegra daily for the next 5 days.  Encouraged increase daily water intake to 64  ounces per day while taking this medication.  Advised if symptoms worsen and/or unresolved please follow-up with PCP or here for further evaluation.     ED Prescriptions     Medication Sig Dispense Auth. Provider   fexofenadine (ALLEGRA ALLERGY) 180 MG tablet Take 1 tablet (180  mg total) by mouth daily for 15 days. 15 tablet Trevor Iha, FNP      PDMP not reviewed this encounter.   Trevor Iha, FNP 07/30/22 [phone]

## 2023-05-10 ENCOUNTER — Ambulatory Visit
Admission: EM | Admit: 2023-05-10 | Discharge: 2023-05-10 | Disposition: A | Discharge status: Home / Self Care | Attending: Family Medicine | Admitting: Family Medicine

## 2023-05-10 ENCOUNTER — Other Ambulatory Visit: Payer: Self-pay

## 2023-05-10 DIAGNOSIS — J441 Chronic obstructive pulmonary disease with (acute) exacerbation: Secondary | ICD-10-CM | POA: Diagnosis not present

## 2023-05-10 DIAGNOSIS — R0602 Shortness of breath: Secondary | ICD-10-CM

## 2023-05-10 DIAGNOSIS — L237 Allergic contact dermatitis due to plants, except food: Secondary | ICD-10-CM | POA: Diagnosis not present

## 2023-05-10 DIAGNOSIS — F17200 Nicotine dependence, unspecified, uncomplicated: Secondary | ICD-10-CM | POA: Diagnosis not present

## 2023-05-10 HISTORY — DX: Chronic obstructive pulmonary disease, unspecified: J44.9

## 2023-05-10 HISTORY — DX: Unspecified asthma, uncomplicated: J45.909

## 2023-05-10 MED ORDER — IPRATROPIUM-ALBUTEROL 0.5-2.5 (3) MG/3ML IN SOLN
3.0000 mL | Freq: Once | RESPIRATORY_TRACT | Status: AC
  Administered 2023-05-10: 3 mL via RESPIRATORY_TRACT

## 2023-05-10 MED ORDER — FEXOFENADINE HCL 180 MG PO TABS: 180.0000 mg | ORAL_TABLET | Freq: Every day | ORAL | 0 refills | Status: AC

## 2023-05-10 MED ORDER — PREDNISONE 20 MG PO TABS: ORAL_TABLET | ORAL | 0 refills | Status: DC

## 2023-05-10 MED ORDER — ALBUTEROL SULFATE HFA 108 (90 BASE) MCG/ACT IN AERS: 2.0000 | INHALATION_SPRAY | RESPIRATORY_TRACT | 1 refills | Status: DC | PRN

## 2023-05-10 NOTE — ED Provider Notes (Signed)
 [facility]    CSN: [id number] Arrival date & time: 05/10/23  1353      History   Chief Complaint Chief Complaint  Patient presents with   Wheezing    HPI Lori Estes is a 78 y.o. female.   Patient is a 78 year old smoker with COPD and asthma who is known to me from prior visits for shortness of breath.  She is here today stating that she has had more shortness of breath since she has been out working in the yard.  She thinks it is from allergies and pollen.  She is out of her albuterol inhaler.  She states she no longer uses Dulera.  No fevers or chills.  No cough or congestion or sputum.  No chest pain with deep breath. She also has a rash on her skin from working out in the yard.  Looks like poison ivy.  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 No pertinent surgical history.  OB History   No obstetric history on file.      Home Medications    Prior to Admission medications   Medication Sig Start Date End Date Taking? Authorizing Provider  predniSONE (DELTASONE) 20 MG tablet Take 2 pills a day for 5 days, then 1 pill a day for 5 days, then discontinue 05/10/23  Yes Eustace Moore, MD  albuterol (VENTOLIN HFA) 108 (90 Base) MCG/ACT inhaler Inhale 2 puffs into the lungs every 4 (four) hours as needed for wheezing or shortness of breath. 05/10/23    Eustace Moore, MD  buPROPion (WELLBUTRIN XL) 300 MG 24 hr tablet Take 1 tablet (300 mg total) by mouth daily. 09/26/17   Breeback, Lonna Cobb, PA-C  fexofenadine (ALLEGRA ALLERGY) 180 MG tablet Take 1 tablet (180 mg total) by mouth daily for 15 days. 05/10/23 05/25/23  Eustace Moore, MD    Family History Family History  Problem Relation Age of Onset   Alcohol abuse Sister     Social History Social History   Tobacco Use   Smoking status: Every Day    Current packs/day: 0.50    Average packs/day: 0.5 packs/day for 60.0 years (30.0 ttl pk-yrs)    Types: Cigarettes   Smokeless tobacco: Never  Vaping Use   Vaping status: Never Used  Substance Use Topics   Alcohol use: Yes    Alcohol/week: 3.0 standard drinks of alcohol    Types: 3 Cans of beer per week   Drug use: Never     Allergies   Ceftin [cefuroxime axetil] and Doxycycline   Review of Systems Review of Systems  See HPI Physical Exam Triage Vital Signs ED Triage Vitals  Encounter Vitals Group     BP 05/10/23 1401 (!) 188/89     Systolic BP Percentile --      Diastolic BP Percentile --  Pulse Rate 05/10/23 1401 (!) 105     Resp 05/10/23 1401 (!) 26     Temp 05/10/23 1401 98.1 F (36.7 C)     Temp Source 05/10/23 1401 Oral     SpO2 05/10/23 1401 94 %     Weight --      Height --      Head Circumference --      Peak Flow --      Pain Score 05/10/23 1405 0     Pain Loc --      Pain Education --      Exclude from Growth Chart --    No data found.  Updated Vital Signs BP (!) 188/89   Pulse (!) 105   Temp 98.1 F (36.7 C) (Oral)   Resp (!) 26   SpO2 94%      Physical Exam Constitutional:      General: She is not in acute distress.    Appearance: She is well-developed and normal weight.     Comments: Short of breath with conversation  HENT:     Head: Normocephalic and atraumatic.  Eyes:     Conjunctiva/sclera: Conjunctivae normal.     Pupils: Pupils are equal, round, and reactive to light.   Cardiovascular:     Rate and Rhythm: Normal rate.  Pulmonary:     Effort: Pulmonary effort is normal. No respiratory distress.     Comments: Tachypnea.  Wheeze throughout.  Decreased air movement Musculoskeletal:        General: Normal range of motion.     Cervical back: Normal range of motion.  Skin:    General: Skin is warm and dry.     Findings: Rash present.     Comments: Patches of vesicular rash on erythematous base on anterior chest  Neurological:     Mental Status: She is alert.      UC Treatments / Results  Labs (all labs ordered are listed, but only abnormal results are displayed) Labs Reviewed - No data to display  EKG   Radiology No results found.  Procedures Procedures (including critical care time)  Medications Ordered in UC Medications  ipratropium-albuterol (DUONEB) 0.5-2.5 (3) MG/3ML nebulizer solution 3 mL (3 mLs Nebulization Given 05/10/23 1429)    Initial Impression / Assessment and Plan / UC Course  I have reviewed the triage vital signs and the nursing notes.  Pertinent labs & imaging results that were available during my care of the patient were reviewed by me and considered in my medical decision making (see chart for details).    Do not see any need for antibiotics.  Will give her prednisone both for the rash on her breathing.  Refill albuterol.  Primary care follow-up is recommended Final Clinical Impressions(s) / UC Diagnoses   Final diagnoses:  COPD exacerbation (HCC)  SOB (shortness of breath)  Tobacco dependence  Allergic contact dermatitis due to plants, except food     Discharge Instructions      I have refilled your albuterol inhaler.  Use it as needed I have refilled your Allegra for allergies Take the prednisone daily as directed See your primary care for follow-up     ED Prescriptions     Medication Sig Dispense Auth. Provider   fexofenadine (ALLEGRA ALLERGY) 180 MG tablet Take 1 tablet (180 mg total) by mouth  daily for 15 days. 15 tablet Eustace Moore, MD   albuterol (VENTOLIN HFA) 108 (90 Base) MCG/ACT inhaler Inhale 2 puffs into  the lungs every 4 (four) hours as needed for wheezing or shortness of breath. 1 each Eustace Moore, MD   predniSONE (DELTASONE) 20 MG tablet Take 2 pills a day for 5 days, then 1 pill a day for 5 days, then discontinue 15 tablet Delton See Letta Pate, MD      PDMP not reviewed this encounter.   Eustace Moore, MD 05/10/23 [phone]

## 2023-05-10 NOTE — Discharge Instructions (Signed)
 I have refilled your albuterol inhaler.  Use it as needed I have refilled your Allegra for allergies Take the prednisone daily as directed See your primary care for follow-up

## 2023-05-10 NOTE — ED Triage Notes (Addendum)
 Has had sob and wheezing, which has progressively gotten worse. Cannot quantify a time she has been sob and wheezing for. Reports she has been outside around pollen. Also reports rash to breasts x 2 days. Audible wheezing. No cp.

## 2023-06-24 ENCOUNTER — Other Ambulatory Visit: Payer: Self-pay

## 2023-06-24 ENCOUNTER — Ambulatory Visit
Admission: EM | Admit: 2023-06-24 | Discharge: 2023-06-24 | Disposition: A | Discharge status: Home / Self Care | Attending: Family Medicine | Admitting: Family Medicine

## 2023-06-24 DIAGNOSIS — J441 Chronic obstructive pulmonary disease with (acute) exacerbation: Secondary | ICD-10-CM

## 2023-06-24 MED ORDER — PREDNISONE 20 MG PO TABS: ORAL_TABLET | ORAL | 0 refills | Status: AC

## 2023-06-24 MED ORDER — ALBUTEROL SULFATE HFA 108 (90 BASE) MCG/ACT IN AERS: 2.0000 | INHALATION_SPRAY | RESPIRATORY_TRACT | 1 refills | Status: AC | PRN

## 2023-06-24 MED ORDER — METHYLPREDNISOLONE SODIUM SUCC 125 MG IJ SOLR
80.0000 mg | Freq: Once | INTRAMUSCULAR | Status: AC
  Administered 2023-06-24: 80 mg via INTRAMUSCULAR

## 2023-06-24 NOTE — ED Provider Notes (Signed)
 [facility]    CSN: [id number] Arrival date & time: 06/24/23  1240      History   Chief Complaint Chief Complaint  Patient presents with   Shortness of Breath    HPI Lori Estes is a 78 y.o. female.   Patient complains of onset of shortness of breath since working outside yesterday.  She denies cough, chest pain or fever and does not feel ill.  She is a smoker with COPD and asthma who has been seen here numerous times in the past for exacerbations.  She was last evaluated here 05/10/23, treated with Allegra , prednisone , and a refill of albuterol  inhaler.  She reports that her symptoms cleared after her last visit.  The history is provided by the patient.    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 No pertinent surgical history.  OB History   No obstetric history on file.      Home Medications    Prior to Admission medications   Medication Sig Start Date End Date Taking? Authorizing Provider  predniSONE  (DELTASONE ) 20 MG tablet Take one tab by mouth twice daily for 4 days, then one daily for 3 days. Take with food. 06/24/23  Yes Leon Rajas, MD  albuterol  (VENTOLIN  HFA) 108 (90 Base) MCG/ACT inhaler Inhale 2 puffs into the lungs every 4 (four) hours as needed for wheezing or shortness of breath. 06/24/23   Leon Rajas, MD  buPROPion  (WELLBUTRIN  XL) 300 MG 24 hr tablet Take 1 tablet (300 mg total) by mouth daily. 09/26/17    Breeback, Jade L, PA-C  fexofenadine  (ALLEGRA  ALLERGY) 180 MG tablet Take 1 tablet (180 mg total) by mouth daily for 15 days. 05/10/23 05/25/23  Stephany Ehrich, MD    Family History Family History  Problem Relation Age of Onset   Alcohol abuse Sister     Social History Social History   Tobacco Use   Smoking status: Every Day    Current packs/day: 0.50    Average packs/day: 0.5 packs/day for 60.0 years (30.0 ttl pk-yrs)    Types: Cigarettes   Smokeless tobacco: Never  Vaping Use   Vaping status: Never Used  Substance Use Topics   Alcohol use: Not Currently    Alcohol/week: 3.0 standard drinks of alcohol    Types: 3 Cans of beer per week   Drug use: Never     Allergies   Ceftin [cefuroxime axetil] and Doxycycline    Review of Systems Review of Systems No sore throat No cough No pleuritic pain + wheezing No nasal congestion No post-nasal drainage No sinus pain/pressure No itchy/red eyes No earache No hemoptysis + SOB No fever/chills No nausea No vomiting No abdominal pain No diarrhea No urinary symptoms No skin rash No fatigue No myalgias No headache   Physical Exam Triage Vital Signs ED Triage Vitals  Encounter Vitals Group     BP 06/24/23 1250 (!) 183/93     Systolic BP Percentile --  Diastolic BP Percentile --      Pulse Rate 06/24/23 1250 92     Resp 06/24/23 1250 16     Temp 06/24/23 1250 98.3 F (36.8 C)     Temp src --      SpO2 06/24/23 1250 95 %     Weight --      Height --      Head Circumference --      Peak Flow --      Pain Score 06/24/23 1253 0     Pain Loc --      Pain Education --      Exclude from Growth Chart --    No data found.  Updated Vital Signs BP (!) 183/93   Pulse 92   Temp 98.3 F (36.8 C)   Resp 16   SpO2 95%   Visual Acuity Right Eye Distance:   Left Eye Distance:   Bilateral Distance:    Right Eye Near:   Left Eye Near:    Bilateral Near:     Physical Exam Nursing notes and Vital Signs  reviewed. Appearance:  Patient appears stated age, and in no acute distress.    Eyes:  Pupils are equal, round, and reactive to light and accomodation.  Extraocular movement is intact.  Conjunctivae are not inflamed   Nose:  Normal Pharynx:  Normal; moist mucous membranes  Neck:  Supple.  No adenopathy Lungs:  .Bilateral wheezes posteriorly.  Breath sounds are equal.   Heart:  Regular rate and rhythm without murmurs, rubs, or gallops.  Abdomen:  Nontender Extremities:  No edema.  Skin:  No rash present.     UC Treatments / Results  Labs (all labs ordered are listed, but only abnormal results are displayed) Labs Reviewed - No data to display  EKG   Radiology No results found.  Procedures Procedures (including critical care time)  Medications Ordered in UC Medications  methylPREDNISolone  sodium succinate (SOLU-MEDROL ) 125 mg/2 mL injection 80 mg (has no administration in time range)    Initial Impression / Assessment and Plan / UC Course  I have reviewed the triage vital signs and the nursing notes.  Pertinent labs & imaging results that were available during my care of the patient were reviewed by me and considered in my medical decision making (see chart for details).    No evidence of infection. Administered Solumedrol 80mg  IM, then begin prednisone  burst/taper tomorrow. Refill albuterol  inhaler. Followup with Family Doctor if not improved in about 10 days.  Final Clinical Impressions(s) / UC Diagnoses   Final diagnoses:  COPD exacerbation [facility])     Discharge Instructions      Begin prednisone  tomorrow 06/25/23.  If symptoms become significantly worse during the night or over the weekend, proceed to the local emergency room.   ED Prescriptions     Medication Sig Dispense Auth. Provider   albuterol  (VENTOLIN  HFA) 108 (90 Base) MCG/ACT inhaler Inhale 2 puffs into the lungs every 4 (four) hours as needed for wheezing or shortness of breath. 15 g Leon Rajas, MD   predniSONE  (DELTASONE ) 20 MG tablet Take one tab by mouth twice daily for 4 days, then one daily for 3 days. Take with food. 11 tablet Leon Rajas, MD         Leon Rajas, MD 06/25/23 Jerolyn Moore

## 2023-06-24 NOTE — Discharge Instructions (Signed)
 Begin prednisone  tomorrow 06/25/23.  If symptoms become significantly worse during the night or over the weekend, proceed to the local emergency room.

## 2023-06-24 NOTE — ED Triage Notes (Signed)
 Has c/o sob since yesterday, no cough. Reports has been working outside a lot. No fever. No otc meds.
